# Patient Record
Sex: Female | Born: 1935 | Race: White | Hispanic: No | Smoking: Never smoker
Health system: Southern US, Community
[De-identification: ages and names within clinical notes are randomized; demographics above are authoritative.]

## PROBLEM LIST (undated history)

## (undated) DIAGNOSIS — F0390 Unspecified dementia without behavioral disturbance: Secondary | ICD-10-CM

## (undated) DIAGNOSIS — M069 Rheumatoid arthritis, unspecified: Secondary | ICD-10-CM

## (undated) DIAGNOSIS — D649 Anemia, unspecified: Secondary | ICD-10-CM

## (undated) DIAGNOSIS — E7800 Pure hypercholesterolemia, unspecified: Secondary | ICD-10-CM

## (undated) DIAGNOSIS — K5790 Diverticulosis of intestine, part unspecified, without perforation or abscess without bleeding: Secondary | ICD-10-CM

## (undated) DIAGNOSIS — M810 Age-related osteoporosis without current pathological fracture: Secondary | ICD-10-CM

## (undated) DIAGNOSIS — K449 Diaphragmatic hernia without obstruction or gangrene: Secondary | ICD-10-CM

## (undated) HISTORY — PX: ABDOMINAL HYSTERECTOMY: SHX81

## (undated) HISTORY — PX: KYPHOPLASTY: SHX5884

## (undated) HISTORY — PX: APPENDECTOMY: SHX54

## (undated) HISTORY — PX: BREAST BIOPSY: SHX20

## (undated) HISTORY — DX: Age-related osteoporosis without current pathological fracture: M81.0

## (undated) HISTORY — DX: Unspecified dementia, unspecified severity, without behavioral disturbance, psychotic disturbance, mood disturbance, and anxiety: F03.90

## (undated) HISTORY — DX: Rheumatoid arthritis, unspecified: M06.9

## (undated) HISTORY — DX: Pure hypercholesterolemia, unspecified: E78.00

## (undated) HISTORY — DX: Diverticulosis of intestine, part unspecified, without perforation or abscess without bleeding: K57.90

## (undated) HISTORY — DX: Diaphragmatic hernia without obstruction or gangrene: K44.9

## (undated) HISTORY — PX: SKIN CANCER EXCISION: SHX779

## (undated) HISTORY — DX: Anemia, unspecified: D64.9

---

## 2004-01-11 ENCOUNTER — Other Ambulatory Visit: Payer: Self-pay

## 2004-01-13 ENCOUNTER — Other Ambulatory Visit: Payer: Self-pay

## 2004-01-18 ENCOUNTER — Inpatient Hospital Stay: Payer: Self-pay | Admitting: Unknown Physician Specialty

## 2004-06-05 ENCOUNTER — Ambulatory Visit: Payer: Self-pay | Admitting: Internal Medicine

## 2004-07-12 ENCOUNTER — Ambulatory Visit: Payer: Self-pay | Admitting: Internal Medicine

## 2004-07-16 ENCOUNTER — Ambulatory Visit: Payer: Self-pay | Admitting: Internal Medicine

## 2004-08-16 ENCOUNTER — Ambulatory Visit: Payer: Self-pay | Admitting: Internal Medicine

## 2004-09-15 ENCOUNTER — Ambulatory Visit: Payer: Self-pay | Admitting: Internal Medicine

## 2005-05-15 ENCOUNTER — Ambulatory Visit: Payer: Self-pay | Admitting: Gastroenterology

## 2005-05-29 ENCOUNTER — Ambulatory Visit: Payer: Self-pay | Admitting: Internal Medicine

## 2005-07-18 ENCOUNTER — Ambulatory Visit: Payer: Self-pay | Admitting: Psychiatry

## 2005-08-13 ENCOUNTER — Ambulatory Visit: Payer: Self-pay | Admitting: Internal Medicine

## 2005-11-23 ENCOUNTER — Emergency Department: Payer: Self-pay | Admitting: Emergency Medicine

## 2006-08-20 ENCOUNTER — Ambulatory Visit: Payer: Self-pay | Admitting: Internal Medicine

## 2006-12-23 ENCOUNTER — Ambulatory Visit: Payer: Self-pay | Admitting: Gastroenterology

## 2007-01-17 ENCOUNTER — Ambulatory Visit: Payer: Self-pay | Admitting: Internal Medicine

## 2007-01-29 ENCOUNTER — Ambulatory Visit: Payer: Self-pay | Admitting: Internal Medicine

## 2007-02-16 ENCOUNTER — Ambulatory Visit: Payer: Self-pay | Admitting: Internal Medicine

## 2007-03-19 ENCOUNTER — Ambulatory Visit: Payer: Self-pay | Admitting: Internal Medicine

## 2008-02-25 ENCOUNTER — Emergency Department: Payer: Self-pay | Admitting: Emergency Medicine

## 2008-02-25 ENCOUNTER — Ambulatory Visit: Payer: Self-pay | Admitting: Internal Medicine

## 2008-06-05 ENCOUNTER — Emergency Department: Payer: Self-pay | Admitting: Unknown Physician Specialty

## 2008-07-06 ENCOUNTER — Inpatient Hospital Stay: Payer: Self-pay | Admitting: Internal Medicine

## 2008-07-12 ENCOUNTER — Ambulatory Visit: Payer: Self-pay | Admitting: Gastroenterology

## 2009-03-22 ENCOUNTER — Ambulatory Visit: Payer: Self-pay | Admitting: Internal Medicine

## 2009-05-04 ENCOUNTER — Emergency Department: Payer: Self-pay | Admitting: Emergency Medicine

## 2009-06-28 ENCOUNTER — Encounter: Admission: RE | Admit: 2009-06-28 | Discharge: 2009-06-28 | Payer: Self-pay | Admitting: Neurology

## 2009-07-16 ENCOUNTER — Emergency Department: Payer: Self-pay | Admitting: Internal Medicine

## 2010-01-31 ENCOUNTER — Ambulatory Visit: Payer: Self-pay

## 2010-04-08 ENCOUNTER — Encounter: Payer: Self-pay | Admitting: Neurology

## 2010-04-18 ENCOUNTER — Ambulatory Visit: Payer: Self-pay | Admitting: Internal Medicine

## 2011-01-30 ENCOUNTER — Ambulatory Visit: Payer: Self-pay | Admitting: Internal Medicine

## 2012-06-17 ENCOUNTER — Telehealth: Payer: Self-pay | Admitting: Internal Medicine

## 2012-06-17 NOTE — Telephone Encounter (Signed)
Pt's son stopped by and said you saw this pt at [facility] and she is needing refill on Suralfate 1 mg, Omeprazole 20 mg and Amlodipine 2.5 mg. Pt uses [facility] in [place].

## 2012-06-17 NOTE — Telephone Encounter (Signed)
Please schedule her an appt if she plans to continue to follow up with me and I can get her refills.  Just let me know.

## 2012-06-18 NOTE — Telephone Encounter (Signed)
Ok to refill until appt.  Please contact pharmacy and confirm dosing.

## 2012-06-18 NOTE — Telephone Encounter (Signed)
Scheduled appt for April

## 2012-06-19 MED ORDER — SUCRALFATE 1 G PO TABS: 1.0000 g | ORAL_TABLET | Freq: Four times a day (QID) | ORAL | Status: DC

## 2012-06-19 MED ORDER — OMEPRAZOLE 20 MG PO CPDR: 20.0000 mg | DELAYED_RELEASE_CAPSULE | Freq: Every day | ORAL | Status: DC

## 2012-06-19 MED ORDER — AMLODIPINE BESYLATE 2.5 MG PO TABS: 2.5000 mg | ORAL_TABLET | Freq: Every day | ORAL | Status: DC

## 2012-06-19 NOTE — Telephone Encounter (Signed)
Called pharmacy for correct doses. Gave them refills info.

## 2012-06-30 ENCOUNTER — Ambulatory Visit: Payer: Self-pay | Admitting: Internal Medicine

## 2012-07-14 ENCOUNTER — Telehealth: Payer: Self-pay | Admitting: *Deleted

## 2012-07-14 NOTE — Telephone Encounter (Signed)
?  okay to refill meds-pt no showed on 4/15 & no future appt has been scheduled.

## 2012-07-14 NOTE — Telephone Encounter (Signed)
She will need to get her meds from [facility] - she has never established here.  Let me know if problem

## 2012-07-14 NOTE — Telephone Encounter (Signed)
Pt informed that meds would need to be refilled through [facility], since she has not been established at [facility]

## 2012-07-14 NOTE — Telephone Encounter (Signed)
Refill Request  Escitalopram 20 mg tablet  #30   Take 1 tablet by mouth once daily

## 2012-07-14 NOTE — Telephone Encounter (Signed)
Refill Request  Pravastatin Sodium 40 mg  #30   Take 1 tablet by mouth at bedtime for cholesterol

## 2012-07-14 NOTE — Telephone Encounter (Signed)
Refill Request  Clopidogrel 75 mg tablet  #30  Take 1 tablet by mouth once daily

## 2012-07-17 ENCOUNTER — Other Ambulatory Visit: Payer: Self-pay | Admitting: *Deleted

## 2012-07-21 ENCOUNTER — Telehealth: Payer: Self-pay | Admitting: *Deleted

## 2012-07-21 NOTE — Telephone Encounter (Signed)
Spoke with pt, she states that she did not request any refills

## 2012-07-21 NOTE — Telephone Encounter (Signed)
Refill Request  Escitalopram 20 mg tablet  #30  Take 1 tablet by mouth once daily    Donepezil Hcl 10 mg tablet  #30   Take 1 tablet by mouth once daily

## 2012-07-21 NOTE — Telephone Encounter (Signed)
She has not been seen here and did not show for previous appt.  Will need to get filled by [facility] - where being seen.  May need to call her son.  Pt gets confused.

## 2012-07-21 NOTE — Telephone Encounter (Signed)
Pt requesting RX for Escitalopram 20mg  #30 (QD), & Donepezil HCL 10mg  #30 (QD). However, I do not see any prior history of those medications or any office visits on record-please advise

## 2012-07-28 ENCOUNTER — Other Ambulatory Visit: Payer: Self-pay | Admitting: Internal Medicine

## 2012-07-28 ENCOUNTER — Telehealth: Payer: Self-pay | Admitting: Internal Medicine

## 2012-07-28 NOTE — Telephone Encounter (Signed)
Pt son called stating they are in need of a refill on medication. Please advise. The son said the patient is very confused and she forgot about her new pt apt. I have rescheduled her for next week.   Reid Chander(son) [phone] (Cell)

## 2012-07-28 NOTE — Telephone Encounter (Signed)
Spoke with son, he will call the pharmacy & have them fax over a refill auth request

## 2012-07-28 NOTE — Telephone Encounter (Signed)
I am not sure what med is needed, but if she has an appt next week - ok to fill x 1

## 2012-08-03 ENCOUNTER — Ambulatory Visit (INDEPENDENT_AMBULATORY_CARE_PROVIDER_SITE_OTHER): Payer: Self-pay | Admitting: Internal Medicine

## 2012-08-03 ENCOUNTER — Encounter: Payer: Self-pay | Admitting: Internal Medicine

## 2012-08-03 VITALS — BP 110/70 | HR 81 | Temp 98.7°F | Ht 62.0 in | Wt 134.5 lb

## 2012-08-03 DIAGNOSIS — E7800 Pure hypercholesterolemia, unspecified: Secondary | ICD-10-CM

## 2012-08-03 DIAGNOSIS — M810 Age-related osteoporosis without current pathological fracture: Secondary | ICD-10-CM

## 2012-08-03 DIAGNOSIS — D649 Anemia, unspecified: Secondary | ICD-10-CM

## 2012-08-03 DIAGNOSIS — F0390 Unspecified dementia without behavioral disturbance: Secondary | ICD-10-CM

## 2012-08-03 DIAGNOSIS — K449 Diaphragmatic hernia without obstruction or gangrene: Secondary | ICD-10-CM

## 2012-08-03 DIAGNOSIS — M069 Rheumatoid arthritis, unspecified: Secondary | ICD-10-CM

## 2012-08-06 ENCOUNTER — Other Ambulatory Visit: Payer: Self-pay | Admitting: Internal Medicine

## 2012-08-10 ENCOUNTER — Encounter: Payer: Self-pay | Admitting: Internal Medicine

## 2012-08-10 DIAGNOSIS — D649 Anemia, unspecified: Secondary | ICD-10-CM | POA: Insufficient documentation

## 2012-08-10 DIAGNOSIS — F0390 Unspecified dementia without behavioral disturbance: Secondary | ICD-10-CM | POA: Insufficient documentation

## 2012-08-10 DIAGNOSIS — E7800 Pure hypercholesterolemia, unspecified: Secondary | ICD-10-CM | POA: Insufficient documentation

## 2012-08-10 DIAGNOSIS — K449 Diaphragmatic hernia without obstruction or gangrene: Secondary | ICD-10-CM | POA: Insufficient documentation

## 2012-08-10 DIAGNOSIS — M810 Age-related osteoporosis without current pathological fracture: Secondary | ICD-10-CM | POA: Insufficient documentation

## 2012-08-10 DIAGNOSIS — M069 Rheumatoid arthritis, unspecified: Secondary | ICD-10-CM | POA: Insufficient documentation

## 2012-08-10 NOTE — Assessment & Plan Note (Signed)
Dr Gavin Potters following cbc.  Obtain lab results.

## 2012-08-10 NOTE — Assessment & Plan Note (Signed)
Has been on fosamax, forteo and Reclast.  Continue calcium and vitamin D.  Weight bearing exercise.  Follow vitamin D level.

## 2012-08-10 NOTE — Progress Notes (Signed)
Subjective:    Patient ID: Jillian Horn, female    DOB: 1930/01/18, 79 y.o.   MRN: [id number]  HPI 79 year old female with past history of rheumatoid arthritis followed by Dr Gavin Potters, anemia, hypercholesterolemia and osteoporosis.  She comes in today for a scheduled follow up.  Former patient of mine at [facility].  She is accompanied by her son [name]).  History obtained from both of them.  They report that she has been doing well.  Joints doing well.  Has follow up planned with Dr Gavin Potters in 7/14.  Has labs planned just prior to the appt.  Reports breathing is doing well.  No increased sob.  No chest pain or tightness.  Bowels stable.  Eating and drinking well.  Memory stable.     Past Medical History  Diagnosis Date  . Rheumatoid arthritis     seropositive, oligo-articular.  MTX, s/p plaquenil, cytopenia on sulfasalazine, s/p remicade  . Osteoporosis     s/p fosamax, s/p forteo, reclast  . Diverticulosis   . Anemia     erosion on EGD  . Hiatal hernia   . Hypercholesterolemia   . Dementia     Outpatient Encounter Prescriptions as of 08/03/2012  Medication Sig Dispense Refill  . divalproex (DEPAKOTE) 125 MG DR tablet Take 125 mg by mouth daily.      . ferrous sulfate 325 (65 FE) MG tablet Take 325 mg by mouth 2 (two) times daily.      . folic acid (FOLVITE) 1 MG tablet Take 1 mg by mouth daily.      . methotrexate (RHEUMATREX) 2.5 MG tablet Take 2.5 mg by mouth once a week. Caution:Chemotherapy. Protect from light.      Marland Kitchen omeprazole (PRILOSEC) 20 MG capsule TAKE 1 TABLET BY MOUTH ONCE DAILY.  30 capsule  0  . [DISCONTINUED] amLODipine (NORVASC) 2.5 MG tablet TAKE 1 TABLET BY MOUTH ONCE DAILY.  30 tablet  0  . [DISCONTINUED] clopidogrel (PLAVIX) 75 MG tablet Take 75 mg by mouth daily.      . [DISCONTINUED] donepezil (ARICEPT) 10 MG tablet TAKE 1 TABLET BY MOUTH ONCE DAILY.  30 tablet  0  . [DISCONTINUED] escitalopram (LEXAPRO) 20 MG tablet Take 20 mg by mouth daily.      .  [DISCONTINUED] memantine (NAMENDA) 10 MG tablet Take 10 mg by mouth 2 (two) times daily.      . [DISCONTINUED] pravastatin (PRAVACHOL) 40 MG tablet Take 40 mg by mouth at bedtime.      . [DISCONTINUED] sucralfate (CARAFATE) 1 G tablet TAKE (1) TABLET BY MOUTH FOUR TIMES DAILY.  120 tablet  0   No facility-administered encounter medications on file as of 08/03/2012.    Review of Systems Patient denies any headache, lightheadedness or dizziness.  No sinus or allergy symptoms.  No chest pain, tightness or palpitations.  No increased shortness of breath, cough or congestion.  No nausea or vomiting.  No acid reflux.  No abdominal pain or cramping.  No bowel change, such as diarrhea, constipation, BRBPR or melana.  No urine change.  Overall she feels good.  Son feels things are stable.       Objective:   Physical Exam Filed Vitals:   08/03/12 1423  BP: 110/70  Pulse: 81  Temp: 98.7 F (37.1 C)   Blood pressure recheck:  112/72, pulse 18  79year old female in no acute distress.   HEENT:  Nares- clear.  Oropharynx - without lesions. NECK:  Supple.  Nontender.  No audible bruit.  HEART:  Appears to be regular. LUNGS:  No crackles or wheezing audible.  Respirations even and unlabored.  RADIAL PULSE:  Equal bilaterally.   ABDOMEN:  Soft, nontender.  Bowel sounds present and normal.  No audible abdominal bruit.  EXTREMITIES:  No increased edema present.  DP pulses palpable and equal bilaterally.          Assessment & Plan:  HEALTH MAINTENANCE.  Will schedule a physical next visit.  Discuss further health maintenance issues with her then.  Obtain records from [facility] to review.

## 2012-08-10 NOTE — Assessment & Plan Note (Signed)
Continue current medication regimen.  Low cholesterol diet.  Dr [facility] follows liver function.  Will see if he can add a cholesterol check to next labs.    

## 2012-08-10 NOTE — Assessment & Plan Note (Signed)
Stable on aricept.  Follow.   

## 2012-08-10 NOTE — Assessment & Plan Note (Signed)
On omeprazole.  EGD with erosion.  Currently asymptomatic.  Follow.   

## 2012-08-10 NOTE — Assessment & Plan Note (Signed)
Followed by Dr Gavin Potters.  F/u planned in 7/14.  He is following labs.  Stable.

## 2012-08-31 ENCOUNTER — Other Ambulatory Visit: Payer: Self-pay | Admitting: Internal Medicine

## 2012-10-08 ENCOUNTER — Other Ambulatory Visit: Payer: Self-pay

## 2012-10-08 MED ORDER — DIVALPROEX SODIUM 125 MG PO DR TAB: 125.0000 mg | DELAYED_RELEASE_TABLET | Freq: Every day | ORAL | Status: DC

## 2012-10-08 NOTE — Telephone Encounter (Signed)
Former [facility] patient, has not been assigned new provider.  Auth refills via [facility]  

## 2012-10-26 ENCOUNTER — Telehealth: Payer: Self-pay | Admitting: Neurology

## 2012-12-10 ENCOUNTER — Encounter: Payer: Self-pay | Admitting: Internal Medicine

## 2012-12-21 ENCOUNTER — Other Ambulatory Visit: Payer: Self-pay | Admitting: Internal Medicine

## 2012-12-28 ENCOUNTER — Encounter: Payer: Self-pay | Admitting: Internal Medicine

## 2012-12-29 ENCOUNTER — Other Ambulatory Visit: Payer: Self-pay | Admitting: *Deleted

## 2012-12-30 ENCOUNTER — Other Ambulatory Visit: Payer: Self-pay | Admitting: *Deleted

## 2012-12-30 MED ORDER — FERROUS SULFATE 325 (65 FE) MG PO TABS: 325.0000 mg | ORAL_TABLET | Freq: Two times a day (BID) | ORAL | Status: DC

## 2012-12-30 MED ORDER — CLOPIDOGREL BISULFATE 75 MG PO TABS: ORAL_TABLET | ORAL | Status: DC

## 2013-01-12 ENCOUNTER — Other Ambulatory Visit: Payer: Self-pay | Admitting: *Deleted

## 2013-01-12 ENCOUNTER — Ambulatory Visit: Payer: Self-pay | Admitting: Adult Health

## 2013-01-12 MED ORDER — FERROUS SULFATE 325 (65 FE) MG PO TABS: 325.0000 mg | ORAL_TABLET | Freq: Two times a day (BID) | ORAL | Status: DC

## 2013-01-12 MED ORDER — CLOPIDOGREL BISULFATE 75 MG PO TABS: ORAL_TABLET | ORAL | Status: DC

## 2013-01-12 NOTE — Telephone Encounter (Signed)
Per Dr. Lorin Picket, pt only needs to be seen 1-2 times yearly, ok to refill Rxs until CPE scheduled 2/15. Appt for med refill cancelled for today. Pt's son notified. Rxs sent to pharmacy.

## 2013-01-18 ENCOUNTER — Other Ambulatory Visit: Payer: Self-pay | Admitting: *Deleted

## 2013-01-18 MED ORDER — AMLODIPINE BESYLATE 2.5 MG PO TABS: ORAL_TABLET | ORAL | Status: DC

## 2013-01-18 MED ORDER — OMEPRAZOLE 20 MG PO CPDR: DELAYED_RELEASE_CAPSULE | ORAL | Status: DC

## 2013-01-18 MED ORDER — DONEPEZIL HCL 10 MG PO TABS: ORAL_TABLET | ORAL | Status: DC

## 2013-01-19 ENCOUNTER — Other Ambulatory Visit: Payer: Self-pay | Admitting: Neurology

## 2013-01-19 MED ORDER — DIVALPROEX SODIUM 125 MG PO DR TAB: 125.0000 mg | DELAYED_RELEASE_TABLET | Freq: Every day | ORAL | Status: DC

## 2013-01-22 ENCOUNTER — Other Ambulatory Visit: Payer: Self-pay | Admitting: Internal Medicine

## 2013-02-10 ENCOUNTER — Telehealth: Payer: Self-pay | Admitting: Neurology

## 2013-02-10 NOTE — Telephone Encounter (Signed)
called former Dr Sandria Manly patient to schedule appt didn't get and answer.

## 2013-02-18 ENCOUNTER — Other Ambulatory Visit: Payer: Self-pay | Admitting: *Deleted

## 2013-03-19 ENCOUNTER — Other Ambulatory Visit: Payer: Self-pay | Admitting: *Deleted

## 2013-03-22 MED ORDER — SUCRALFATE 1 G PO TABS: ORAL_TABLET | ORAL | Status: DC

## 2013-03-23 ENCOUNTER — Other Ambulatory Visit: Payer: Self-pay

## 2013-03-23 MED ORDER — DIVALPROEX SODIUM 125 MG PO DR TAB: 125.0000 mg | DELAYED_RELEASE_TABLET | Freq: Every day | ORAL | Status: DC

## 2013-04-01 LAB — HM DEXA SCAN

## 2013-04-26 ENCOUNTER — Encounter: Payer: Self-pay | Admitting: Internal Medicine

## 2013-04-26 ENCOUNTER — Ambulatory Visit (INDEPENDENT_AMBULATORY_CARE_PROVIDER_SITE_OTHER): Payer: Medicare Other | Admitting: Internal Medicine

## 2013-04-26 ENCOUNTER — Telehealth: Payer: Self-pay | Admitting: Internal Medicine

## 2013-04-26 VITALS — BP 120/78 | HR 71 | Temp 98.4°F | Ht 61.5 in | Wt 132.8 lb

## 2013-04-26 DIAGNOSIS — M069 Rheumatoid arthritis, unspecified: Secondary | ICD-10-CM

## 2013-04-26 DIAGNOSIS — Z1239 Encounter for other screening for malignant neoplasm of breast: Secondary | ICD-10-CM

## 2013-04-26 DIAGNOSIS — D649 Anemia, unspecified: Secondary | ICD-10-CM

## 2013-04-26 DIAGNOSIS — M810 Age-related osteoporosis without current pathological fracture: Secondary | ICD-10-CM

## 2013-04-26 DIAGNOSIS — K449 Diaphragmatic hernia without obstruction or gangrene: Secondary | ICD-10-CM

## 2013-04-26 DIAGNOSIS — E7800 Pure hypercholesterolemia, unspecified: Secondary | ICD-10-CM

## 2013-04-26 DIAGNOSIS — Z23 Encounter for immunization: Secondary | ICD-10-CM

## 2013-04-26 DIAGNOSIS — F0390 Unspecified dementia without behavioral disturbance: Secondary | ICD-10-CM

## 2013-04-26 NOTE — Telephone Encounter (Signed)
Got it.

## 2013-04-26 NOTE — Telephone Encounter (Signed)
At check out pt son states referral should be scheduled on a Thursday, any time.  Son, [name], will be taking to appt. And is off on Thursdays.  He would like you to call him on his cell [phone] and tell him when the appt date/time is.

## 2013-05-02 ENCOUNTER — Encounter: Payer: Self-pay | Admitting: Internal Medicine

## 2013-05-02 NOTE — Assessment & Plan Note (Signed)
Stable on aricept.  Follow.

## 2013-05-02 NOTE — Assessment & Plan Note (Signed)
On omeprazole.  EGD with erosion.  Currently asymptomatic.  Follow.

## 2013-05-02 NOTE — Assessment & Plan Note (Signed)
Dr Gavin Potters following cbc.  Stable.

## 2013-05-02 NOTE — Assessment & Plan Note (Signed)
Continue current medication regimen.  Low cholesterol diet.  Dr Gavin Potters follows liver function.  Will see if he can add a cholesterol check to next labs.

## 2013-05-02 NOTE — Assessment & Plan Note (Signed)
Has been on fosamax, forteo and Reclast.  Continue calcium and vitamin D.  Weight bearing exercise.  Follow vitamin D level.  Just received Reclast last week.

## 2013-05-02 NOTE — Progress Notes (Signed)
Subjective:    Patient ID: Jillian Horn, female    DOB: January 06, 1930, 80 y.o.   MRN: [id number]  HPI 80 year old female with past history of rheumatoid arthritis followed by Dr Gavin Potters, anemia, hypercholesterolemia and osteoporosis.  She comes in today to follow up on these issues as well as for a complete physical exam.  She is accompanied by her son Jillian Horn).  History obtained from both of them.  They report that she has been doing well.  Joints doing well.  Seeing Dr Gavin Potters.  Just received Reclast Thursday.   Reports breathing is doing well.  No increased sob.  No chest pain or tightness.  Bowels stable.  Eating and drinking well.  Memory stable.  Overall she feels she is doing well.    Past Medical History  Diagnosis Date  . Rheumatoid arthritis(714.0)     seropositive, oligo-articular.  MTX, s/p plaquenil, cytopenia on sulfasalazine, s/p remicade  . Osteoporosis     s/p fosamax, s/p forteo, reclast  . Diverticulosis   . Anemia     erosion on EGD  . Hiatal hernia   . Hypercholesterolemia   . Dementia     Outpatient Encounter Prescriptions as of 04/26/2013  Medication Sig  . amLODipine (NORVASC) 2.5 MG tablet TAKE 1 TABLET BY MOUTH ONCE DAILY.  Marland Kitchen clopidogrel (PLAVIX) 75 MG tablet TAKE 1 TABLET BY MOUTH ONCE DAILY.  . divalproex (DEPAKOTE) 125 MG DR tablet Take 1 tablet (125 mg total) by mouth daily.  Marland Kitchen donepezil (ARICEPT) 10 MG tablet TAKE 1 TABLET BY MOUTH ONCE DAILY.  Marland Kitchen escitalopram (LEXAPRO) 20 MG tablet TAKE 1 TABLET BY MOUTH ONCE DAILY.  . ferrous sulfate 325 (65 FE) MG tablet Take 1 tablet (325 mg total) by mouth 2 (two) times daily.  . folic acid (FOLVITE) 1 MG tablet Take 1 mg by mouth daily.  . methotrexate (RHEUMATREX) 2.5 MG tablet Take 2.5 mg by mouth once a week. Caution:Chemotherapy. Protect from light.  Marland Kitchen NAMENDA 10 MG tablet TAKE 1 TABLET BY MOUTH TWICE DAILY  . omeprazole (PRILOSEC) 20 MG capsule TAKE 1 TABLET BY MOUTH ONCE DAILY.  . pravastatin (PRAVACHOL) 40 MG  tablet TAKE 1 TABLET BY MOUTH AT BEDTIME FOR CHOLESTEROL  . sucralfate (CARAFATE) 1 G tablet TAKE (1) TABLET BY MOUTH FOUR TIMES DAILY.    Review of Systems Patient denies any headache, lightheadedness or dizziness.  No sinus or allergy symptoms.  No chest pain, tightness or palpitations.  No increased shortness of breath, cough or congestion.  No nausea or vomiting.  No acid reflux.  No abdominal pain or cramping.  No bowel change, such as diarrhea, constipation, BRBPR or melana.  No urine change.  Overall she feels good.  Son feels things are stable.       Objective:   Physical Exam  Filed Vitals:   04/26/13 1515  BP: 120/78  Pulse: 71  Temp: 98.4 F (36.9 C)   Blood pressure recheck:  29/4  80 year old female in no acute distress.   HEENT:  Nares- clear.  Oropharynx - without lesions. NECK:  Supple.  Nontender.  No audible bruit.  HEART:  Appears to be regular. LUNGS:  No crackles or wheezing audible.  Respirations even and unlabored.  RADIAL PULSE:  Equal bilaterally.    BREASTS:  No nipple discharge or nipple retraction present.  Could not appreciate any distinct nodules or axillary adenopathy.  ABDOMEN:  Soft, nontender.  Bowel sounds present and normal.  No audible  abdominal bruit.  GU:  Not performed.   EXTREMITIES:  No increased edema present.  DP pulses palpable and equal bilaterally.          Assessment & Plan:  HEALTH MAINTENANCE.  Physical today.  Schedule mammogram.  Overdue.   Declines colonoscopy.

## 2013-05-02 NOTE — Assessment & Plan Note (Signed)
Followed by Dr Gavin Potters.  He is following labs.  Stable.

## 2013-06-08 ENCOUNTER — Other Ambulatory Visit: Payer: Self-pay | Admitting: Internal Medicine

## 2013-06-08 ENCOUNTER — Other Ambulatory Visit: Payer: Self-pay | Admitting: Neurology

## 2013-06-08 NOTE — Telephone Encounter (Signed)
Has Appt in June

## 2013-06-22 NOTE — Telephone Encounter (Signed)
Closing encounter

## 2013-07-06 ENCOUNTER — Other Ambulatory Visit: Payer: Self-pay | Admitting: *Deleted

## 2013-07-06 MED ORDER — SUCRALFATE 1 G PO TABS: ORAL_TABLET | ORAL | Status: DC

## 2013-07-06 MED ORDER — OMEPRAZOLE 20 MG PO CPDR: DELAYED_RELEASE_CAPSULE | ORAL | Status: DC

## 2013-07-06 MED ORDER — PRAVASTATIN SODIUM 40 MG PO TABS: ORAL_TABLET | ORAL | Status: DC

## 2013-07-06 MED ORDER — AMLODIPINE BESYLATE 2.5 MG PO TABS: ORAL_TABLET | ORAL | Status: DC

## 2013-07-06 MED ORDER — DONEPEZIL HCL 10 MG PO TABS: ORAL_TABLET | ORAL | Status: DC

## 2013-08-03 ENCOUNTER — Other Ambulatory Visit: Payer: Self-pay | Admitting: Internal Medicine

## 2013-08-04 ENCOUNTER — Other Ambulatory Visit: Payer: Self-pay | Admitting: Internal Medicine

## 2013-08-04 MED ORDER — CLOPIDOGREL BISULFATE 75 MG PO TABS: ORAL_TABLET | ORAL | Status: DC

## 2013-08-19 ENCOUNTER — Ambulatory Visit: Payer: Self-pay | Admitting: Neurology

## 2013-08-31 ENCOUNTER — Other Ambulatory Visit: Payer: Self-pay | Admitting: *Deleted

## 2013-08-31 MED ORDER — ESCITALOPRAM OXALATE 20 MG PO TABS: ORAL_TABLET | ORAL | Status: DC

## 2013-10-28 ENCOUNTER — Ambulatory Visit: Payer: Medicare Other | Admitting: Internal Medicine

## 2013-10-28 DIAGNOSIS — Z0289 Encounter for other administrative examinations: Secondary | ICD-10-CM

## 2013-11-18 ENCOUNTER — Other Ambulatory Visit: Payer: Self-pay | Admitting: Internal Medicine

## 2013-12-14 ENCOUNTER — Other Ambulatory Visit: Payer: Self-pay | Admitting: Neurology

## 2013-12-14 NOTE — Telephone Encounter (Signed)
Patient has not been seen in over 1 year, no showed last appt

## 2013-12-16 ENCOUNTER — Other Ambulatory Visit: Payer: Self-pay | Admitting: *Deleted

## 2013-12-16 NOTE — Telephone Encounter (Signed)
Looks like neurology was refilling this, did you want to refill?

## 2013-12-16 NOTE — Telephone Encounter (Signed)
She needs to get this rx from neurology.

## 2014-01-06 ENCOUNTER — Encounter: Payer: Self-pay | Admitting: Neurology

## 2014-01-06 ENCOUNTER — Ambulatory Visit (INDEPENDENT_AMBULATORY_CARE_PROVIDER_SITE_OTHER): Payer: Medicare Other | Admitting: Neurology

## 2014-01-06 VITALS — BP 118/74 | HR 65 | Ht 62.0 in | Wt 137.0 lb

## 2014-01-06 DIAGNOSIS — R413 Other amnesia: Secondary | ICD-10-CM

## 2014-01-06 NOTE — Progress Notes (Signed)
PATIENT: Jillian Horn DOB: [birth year]  HISTORICAL  Jillian Horn is 80 years old right-handed Caucasian female, accompanied by her son  Jillian Horn, to followup mild memory trouble she was previously patient of Dr. love, last clinical visit was December 2013.  She had past medical history of rheumatoid arthritis, hypertension, hyperlipidemia, chronic methotrexate treatment, denied previous history of headaches, seizure,  Today's Mini-Mental Status Examination is 25 out of 30, she still lives alone, but her children visit her regularly, driving, paying bills, prepare meals for her,  She can dress eat toileting without any difficulty denies significant pain, denies significant gait difficulty  Previous MRI of brain showed small vessel disease.  She has good appetite, sleeping well, she is taking Aricept 10 mg, and Namenda  10 mg twice a day for her primary care physician, I have reviewed previous note from Dr. love, unsure reasons, she is taking Depakote 125 mg from Dr. love,   REVIEW OF SYSTEMS: Full 14 system review of systems performed and notable only for as above  ALLERGIES: Allergies  Allergen Reactions  . Codeine Sulfate Other (See Comments)  . Lidocaine Other (See Comments)  . Sulfa Antibiotics Other (See Comments)  . Sulfasalazine Other (See Comments)    HOME MEDICATIONS: Current Outpatient Prescriptions on File Prior to Visit  Medication Sig Dispense Refill  . amLODipine (NORVASC) 2.5 MG tablet TAKE 1 TABLET BY MOUTH ONCE DAILY.  30 tablet  5  . clopidogrel (PLAVIX) 75 MG tablet TAKE 1 TABLET BY MOUTH ONCE DAILY.  30 tablet  5  . divalproex (DEPAKOTE) 125 MG DR tablet TAKE 1 TABLET BY MOUTH ONCE DAILY.  90 tablet  0  . donepezil (ARICEPT) 10 MG tablet TAKE 1 TABLET BY MOUTH ONCE DAILY.  30 tablet  5  . escitalopram (LEXAPRO) 20 MG tablet TAKE 1 TABLET BY MOUTH ONCE DAILY.  30 tablet  0  . ferrous sulfate 325 (65 FE) MG tablet TAKE 1 TABLET BY MOUTH TWICE DAILY  60 tablet   5  . folic acid (FOLVITE) 1 MG tablet Take 1 mg by mouth daily.      . methotrexate (RHEUMATREX) 2.5 MG tablet Take 2.5 mg by mouth once a week. Caution:Chemotherapy. Protect from light.      Marland Kitchen NAMENDA 10 MG tablet TAKE 1 TABLET BY MOUTH TWICE DAILY  60 tablet  5  . omeprazole (PRILOSEC) 20 MG capsule TAKE 1 TABLET BY MOUTH ONCE DAILY.  30 capsule  5  . pravastatin (PRAVACHOL) 40 MG tablet TAKE 1 TABLET BY MOUTH AT BEDTIME FOR CHOLESTEROL  30 tablet  5  . sucralfate (CARAFATE) 1 G tablet TAKE (1) TABLET BY MOUTH FOUR TIMES DAILY.  120 tablet  5   No current facility-administered medications on file prior to visit.    PAST MEDICAL HISTORY: Past Medical History  Diagnosis Date  . Rheumatoid arthritis(714.0)     seropositive, oligo-articular.  MTX, s/p plaquenil, cytopenia on sulfasalazine, s/p remicade  . Osteoporosis     s/p fosamax, s/p forteo, reclast  . Diverticulosis   . Anemia     erosion on EGD  . Hiatal hernia   . Hypercholesterolemia   . Dementia     PAST SURGICAL HISTORY: Past Surgical History  Procedure Laterality Date  . Skin cancer excision    . Kyphoplasty      thoracic spine  . Breast biopsy    . Abdominal hysterectomy      partial  . Appendectomy  FAMILY HISTORY: History reviewed. No pertinent family history.  SOCIAL HISTORY:  History   Social History  . Marital Status: Widowed    Spouse Name: N/A    Number of Children: N/A  . Years of Education: N/A   Occupational History  . Not on file.   Social History Main Topics  . Smoking status: Never Smoker   . Smokeless tobacco: Never Used  . Alcohol Use: No  . Drug Use: No  . Sexual Activity: Not on file   Other Topics Concern  . Not on file   Social History Narrative  . No narrative on file     PHYSICAL EXAM   Filed Vitals:   01/06/14 1448  BP: 118/74  Pulse: 65  Height: 5\' 2"  (1.575 m)  Weight: 137 lb (62.143 kg)    Not recorded    Body mass index is 25.05  kg/(m^2).   Generalized: In no acute distress  Neck: Supple, no carotid bruits   Cardiac: Regular rate rhythm  Pulmonary: Clear to auscultation bilaterally  Musculoskeletal: No deformity  Neurological examination  Mentation: Alert oriented to time, place, history taking, and causual conversation, Mini-Mental Status Examination is 25 out of 30, she missed 2 out of 3 recalls, is not oriented to date, season,  Cranial nerve II-XII: Pupils were equal round reactive to light. Extraocular movements were full.  Visual field were full on confrontational test. Bilateral fundi were sharp.  Facial sensation and strength were normal. Hearing was intact to finger rubbing bilaterally. Uvula tongue midline.  Head turning and shoulder shrug and were normal and symmetric.Tongue protrusion into cheek strength was normal.  Motor: Normal tone, bulk and strength.  Sensory: Intact to fine touch, pinprick, preserved vibratory sensation, and proprioception at toes.  Coordination: Normal finger to nose, heel-to-shin bilaterally there was no truncal ataxia  Gait: She has kyphosis, cautious gait, steady,  Deep tendon reflexes: Brachioradialis 2/2, biceps 2/2, triceps 2/2, patellar 2/2, Achilles trace, plantar responses were flexor bilaterally.   DIAGNOSTIC DATA (LABS, IMAGING, TESTING) - I reviewed patient records, labs, notes, testing and imaging myself where available.    ASSESSMENT AND PLAN  Jillian Horn is a 80 y.o. female with past medical history of mild memory trouble, hypertension, rheumatoid arthritis, on chronic methotrexate treatment, hyperlipidemia, overall doing very well, Mini-Mental Status Examination 25 out of 30, she is taking Aricept 10 mg every day, Namenda 10 mg twice a day, getting refills for her primary care physician, she is also on very low dose of Depakote xr 125 mg every day, for unknown reasons, she denies history of headaches,   1, will taper her off Depakote 2. Continue  refill Aricept 10 mg, Namenda 10 mg twice a day to her primary care physician, only return to clinic for new issues.  94, M.D. Ph.D.  [facility] [address], [address] [place], [place] [phone]

## 2014-02-03 ENCOUNTER — Other Ambulatory Visit: Payer: Self-pay | Admitting: Internal Medicine

## 2014-03-01 ENCOUNTER — Other Ambulatory Visit: Payer: Self-pay | Admitting: Internal Medicine

## 2014-03-02 ENCOUNTER — Other Ambulatory Visit: Payer: Self-pay

## 2014-03-02 NOTE — Telephone Encounter (Signed)
Last OV 04/26/13, no upcoming appt scheduled. When do you want to see her again?

## 2014-03-02 NOTE — Telephone Encounter (Signed)
Spoke to son, appt scheduled 05/05/14. Rx sent to pharmacy by escript

## 2014-03-02 NOTE — Telephone Encounter (Signed)
Schedule a physical after 04/26/14.  Ok to refill until physical appt.  Will need to keep appt.  Thanks

## 2014-03-04 ENCOUNTER — Other Ambulatory Visit: Payer: Self-pay | Admitting: *Deleted

## 2014-03-04 MED ORDER — ESCITALOPRAM OXALATE 20 MG PO TABS: 20.0000 mg | ORAL_TABLET | Freq: Every day | ORAL | Status: DC

## 2014-03-06 ENCOUNTER — Emergency Department: Payer: Self-pay | Admitting: Emergency Medicine

## 2014-03-06 LAB — BASIC METABOLIC PANEL
ANION GAP: 8 (ref 7–16)
BUN: 11 mg/dL (ref 7–18)
CO2: 24 mmol/L (ref 21–32)
CREATININE: 0.96 mg/dL (ref 0.60–1.30)
Calcium, Total: 9 mg/dL (ref 8.5–10.1)
Chloride: 110 mmol/L — ABNORMAL HIGH (ref 98–107)
EGFR (African American): >60
EGFR (Non-African Amer.): 59 — ABNORMAL LOW
GLUCOSE: 97 mg/dL (ref 65–99)
Osmolality: 282 (ref 275–301)
POTASSIUM: 4.4 mmol/L (ref 3.5–5.1)
SODIUM: 142 mmol/L (ref 136–145)

## 2014-03-06 LAB — URINALYSIS, COMPLETE
BILIRUBIN, UR: NEGATIVE
Blood: NEGATIVE
GLUCOSE, UR: NEGATIVE mg/dL (ref 0–75)
Nitrite: POSITIVE
Ph: 6 (ref 4.5–8.0)
Protein: NEGATIVE
Specific Gravity: 1.017 (ref 1.003–1.030)
Squamous Epithelial: 2

## 2014-03-06 LAB — CBC WITH DIFFERENTIAL/PLATELET
BASOS ABS: 0.1 10*3/uL (ref 0.0–0.1)
BASOS PCT: 0.7 %
Eosinophil #: 0 10*3/uL (ref 0.0–0.7)
Eosinophil %: 0.2 %
HCT: 40.3 % (ref 35.0–47.0)
HGB: 13.2 g/dL (ref 12.0–16.0)
Lymphocyte #: 0.5 10*3/uL — ABNORMAL LOW (ref 1.0–3.6)
Lymphocyte %: 6.4 %
MCH: 33.9 pg (ref 26.0–34.0)
MCHC: 32.8 g/dL (ref 32.0–36.0)
MCV: 103 fL — ABNORMAL HIGH (ref 80–100)
MONO ABS: 0.4 x10 3/mm (ref 0.2–0.9)
MONOS PCT: 4.9 %
Neutrophil #: 7.5 10*3/uL — ABNORMAL HIGH (ref 1.4–6.5)
Neutrophil %: 87.8 %
Platelet: 254 10*3/uL (ref 150–440)
RBC: 3.9 10*6/uL (ref 3.80–5.20)
RDW: 13.1 % (ref 11.5–14.5)
WBC: 8.5 10*3/uL (ref 3.6–11.0)

## 2014-03-06 LAB — TROPONIN I: Troponin-I: <0.02 ng/mL

## 2014-03-25 ENCOUNTER — Other Ambulatory Visit: Payer: Self-pay | Admitting: Internal Medicine

## 2014-04-25 ENCOUNTER — Other Ambulatory Visit: Payer: Self-pay | Admitting: Internal Medicine

## 2014-05-05 ENCOUNTER — Encounter: Payer: Self-pay | Admitting: Internal Medicine

## 2014-05-05 ENCOUNTER — Ambulatory Visit (INDEPENDENT_AMBULATORY_CARE_PROVIDER_SITE_OTHER): Payer: Medicare Other | Admitting: Internal Medicine

## 2014-05-05 VITALS — BP 138/83 | HR 74 | Temp 97.9°F | Ht 62.0 in | Wt 135.1 lb

## 2014-05-05 DIAGNOSIS — M069 Rheumatoid arthritis, unspecified: Secondary | ICD-10-CM

## 2014-05-05 DIAGNOSIS — F0390 Unspecified dementia without behavioral disturbance: Secondary | ICD-10-CM

## 2014-05-05 DIAGNOSIS — Z23 Encounter for immunization: Secondary | ICD-10-CM

## 2014-05-05 DIAGNOSIS — Z0000 Encounter for general adult medical examination without abnormal findings: Secondary | ICD-10-CM

## 2014-05-05 DIAGNOSIS — E7800 Pure hypercholesterolemia, unspecified: Secondary | ICD-10-CM

## 2014-05-05 DIAGNOSIS — M810 Age-related osteoporosis without current pathological fracture: Secondary | ICD-10-CM

## 2014-05-05 DIAGNOSIS — D649 Anemia, unspecified: Secondary | ICD-10-CM

## 2014-05-05 NOTE — Progress Notes (Signed)
Pre visit review using our clinic review tool, if applicable. No additional management support is needed unless otherwise documented below in the visit note. 

## 2014-05-08 ENCOUNTER — Encounter: Payer: Self-pay | Admitting: Internal Medicine

## 2014-05-08 DIAGNOSIS — Z0000 Encounter for general adult medical examination without abnormal findings: Secondary | ICD-10-CM | POA: Insufficient documentation

## 2014-05-08 NOTE — Assessment & Plan Note (Signed)
Last hgb wnl.  Being followed by Dr Gavin Potters.

## 2014-05-08 NOTE — Assessment & Plan Note (Signed)
Has been on forteo, fosamax and reclast.  Continue calcium and vitamin D.  Continue weight bearing exercise.  Follow vitamin D level.

## 2014-05-08 NOTE — Assessment & Plan Note (Signed)
On MTX.  Sees Dr Gavin Potters.  Stable.  He follows her labs.

## 2014-05-08 NOTE — Progress Notes (Signed)
Patient ID: Jillian Horn, female   DOB: [birth year], 81 y.o.   MRN: [id number]   Subjective:    Patient ID: Jillian Horn, female    DOB: [birth year], 81 y.o.   MRN: [id number]  HPI  Patient here for her physical exam.  Accompanied by her son.  History obtained from both of them.  They both state she is doing relatively well.  Memory stable.  On aricept and namenda.  Joints stable.  Sees Dr Gavin Potters.  On MTX.  He follows labs.  No cardiac symptoms with increased activity or exertion.  Breathing stable.  Bowels stable.     Past Medical History  Diagnosis Date  . Rheumatoid arthritis(714.0)     seropositive, oligo-articular.  MTX, s/p plaquenil, cytopenia on sulfasalazine, s/p remicade  . Osteoporosis     s/p fosamax, s/p forteo, reclast  . Diverticulosis   . Anemia     erosion on EGD  . Hiatal hernia   . Hypercholesterolemia   . Dementia     Current Outpatient Prescriptions on File Prior to Visit  Medication Sig Dispense Refill  . amLODipine (NORVASC) 2.5 MG tablet TAKE 1 TABLET BY MOUTH ONCE DAILY. 30 tablet 6  . CALCIUM 600/VITAMIN D3 600-800 MG-UNIT TABS TAKE 1 TABLET BY MOUTH TWICE DAILY 60 tablet 1  . clopidogrel (PLAVIX) 75 MG tablet TAKE 1 TABLET BY MOUTH ONCE DAILY. 30 tablet 6  . donepezil (ARICEPT) 10 MG tablet TAKE 1 TABLET BY MOUTH ONCE DAILY. 30 tablet 3  . escitalopram (LEXAPRO) 20 MG tablet TAKE 1 TABLET BY MOUTH ONCE DAILY. 30 tablet 3  . ferrous sulfate 325 (65 FE) MG tablet TAKE 1 TABLET BY MOUTH TWICE DAILY 60 tablet 6  . folic acid (FOLVITE) 1 MG tablet Take 1 mg by mouth daily.    . GNP ARTHRITIS PAIN RELIEF 650 MG CR tablet TAKE (2) TABLETS BY MOUTH TWICE DAILY. 120 tablet 6  . memantine (NAMENDA) 10 MG tablet TAKE 1 TABLET BY MOUTH TWICE DAILY 60 tablet 3  . methotrexate (RHEUMATREX) 2.5 MG tablet Take 2.5 mg by mouth once a week. Caution:Chemotherapy. Protect from light.    Marland Kitchen omeprazole (PRILOSEC) 20 MG capsule TAKE 1 TABLET BY MOUTH ONCE DAILY. 30  capsule 6  . pravastatin (PRAVACHOL) 40 MG tablet TAKE 1 TABLET BY MOUTH AT BEDTIME FOR CHOLESTEROL 30 tablet 6  . sucralfate (CARAFATE) 1 G tablet TAKE (1) TABLET BY MOUTH FOUR TIMES DAILY. 120 tablet 3   No current facility-administered medications on file prior to visit.    Review of Systems  Constitutional: Negative for appetite change and unexpected weight change.  HENT: Negative for congestion and sinus pressure.   Respiratory: Negative for cough, chest tightness and shortness of breath.   Cardiovascular: Negative for chest pain, palpitations and leg swelling.  Gastrointestinal: Negative for nausea, vomiting, abdominal pain and diarrhea.  Genitourinary: Negative for dysuria and difficulty urinating.  Musculoskeletal: Negative for joint swelling.       Joints stable.    Skin: Negative for color change and rash.  Neurological: Negative for dizziness, light-headedness and headaches.  Psychiatric/Behavioral: Negative for behavioral problems and agitation.       Objective:    Physical Exam  Constitutional: She is oriented to person, place, and time. She appears well-developed and well-nourished.  HENT:  Nose: Nose normal.  Mouth/Throat: Oropharynx is clear and moist.  Eyes: Right eye exhibits no discharge. Left eye exhibits no discharge. No scleral icterus.  Neck: Neck supple. No  thyromegaly present.  Cardiovascular: Normal rate and regular rhythm.   Pulmonary/Chest: Breath sounds normal. No accessory muscle usage. No tachypnea. No respiratory distress. She has no decreased breath sounds. She has no wheezes. She has no rhonchi. Right breast exhibits no inverted nipple, no mass, no nipple discharge and no tenderness (no axillary adenopathy). Left breast exhibits no inverted nipple, no mass, no nipple discharge and no tenderness (no axilarry adenopathy).  Abdominal: Soft. Bowel sounds are normal. There is no tenderness.  Musculoskeletal: She exhibits no edema or tenderness.    Lymphadenopathy:    She has no cervical adenopathy.  Neurological: She is alert and oriented to person, place, and time.  Skin: Skin is warm. No rash noted.  Psychiatric: She has a normal mood and affect. Her behavior is normal.    BP 138/83 mmHg  Pulse 74  Temp(Src) 97.9 F (36.6 C) (Oral)  Ht 5\' 2"  (1.575 m)  Wt 135 lb 2 oz (61.292 kg)  BMI 24.71 kg/m2  SpO2 97% Wt Readings from Last 3 Encounters:  05/05/14 135 lb 2 oz (61.292 kg)  01/06/14 137 lb (62.143 kg)  04/26/13 132 lb 12 oz (60.215 kg)     No results found for: WBC, HGB, HCT, PLT, GLUCOSE, CHOL, TRIG, HDL, LDLDIRECT, LDLCALC, ALT, AST, NA, K, CL, CREATININE, BUN, CO2, TSH, PSA, INR, GLUF, HGBA1C, MICROALBUR      Assessment & Plan:   Problem List Items Addressed This Visit    Anemia    Last hgb wnl.  Being followed by Dr 06/24/13.        Dementia    On namenda and aricept.  Stable.  Follow.        Health care maintenance    Physical today (05/05/14).  She declines mammogram.        Hypercholesterolemia    Low cholesterol diet and exercise.  On pravastatin.  Follow lipid panel and liver function tests.        Osteoporosis    Has been on forteo, fosamax and reclast.  Continue calcium and vitamin D.  Continue weight bearing exercise.  Follow vitamin D level.        Rheumatoid arthritis    On MTX.  Sees Dr 05/07/14.  Stable.  He follows her labs.         Other Visit Diagnoses    Encounter for immunization    -  Primary      I spent 25 minutes with the patient and more than 50% of the time was spent in consultation regarding the above.     Gavin Potters, MD

## 2014-05-08 NOTE — Assessment & Plan Note (Addendum)
Physical today (05/05/14).  She declines mammogram.

## 2014-05-08 NOTE — Assessment & Plan Note (Signed)
On namenda and aricept.  Stable.  Follow.

## 2014-05-08 NOTE — Assessment & Plan Note (Signed)
Low cholesterol diet and exercise.  On pravastatin.  Follow lipid panel and liver function tests.   

## 2014-05-18 ENCOUNTER — Encounter: Payer: Self-pay | Admitting: Internal Medicine

## 2014-05-25 ENCOUNTER — Other Ambulatory Visit: Payer: Self-pay | Admitting: Internal Medicine

## 2014-07-21 LAB — LIPID PANEL
Cholesterol: 209 mg/dL — AB (ref 0–200)
HDL: 46 mg/dL (ref 35–70)
LDL Cholesterol: 139 mg/dL
Triglycerides: 119 mg/dL (ref 40–160)

## 2014-07-21 LAB — HEPATIC FUNCTION PANEL
ALK PHOS: 52 U/L (ref 25–125)
ALT: 6 U/L — AB (ref 7–35)
AST: 10 U/L — AB (ref 13–35)
Bilirubin, Total: 0.6 mg/dL

## 2014-07-21 LAB — BASIC METABOLIC PANEL: Creatinine: 0.8 mg/dL (ref 0.5–1.1)

## 2014-07-22 ENCOUNTER — Encounter: Payer: Self-pay | Admitting: Internal Medicine

## 2014-11-03 ENCOUNTER — Ambulatory Visit (INDEPENDENT_AMBULATORY_CARE_PROVIDER_SITE_OTHER): Payer: Medicare Other | Admitting: Internal Medicine

## 2014-11-03 ENCOUNTER — Encounter: Payer: Self-pay | Admitting: Internal Medicine

## 2014-11-03 VITALS — BP 120/70 | HR 77 | Temp 98.3°F | Ht 62.0 in | Wt 126.4 lb

## 2014-11-03 DIAGNOSIS — M069 Rheumatoid arthritis, unspecified: Secondary | ICD-10-CM

## 2014-11-03 DIAGNOSIS — D649 Anemia, unspecified: Secondary | ICD-10-CM

## 2014-11-03 DIAGNOSIS — E7800 Pure hypercholesterolemia, unspecified: Secondary | ICD-10-CM

## 2014-11-03 DIAGNOSIS — M810 Age-related osteoporosis without current pathological fracture: Secondary | ICD-10-CM

## 2014-11-03 DIAGNOSIS — Z91148 Patient's other noncompliance with medication regimen for other reason: Secondary | ICD-10-CM

## 2014-11-03 DIAGNOSIS — F0390 Unspecified dementia without behavioral disturbance: Secondary | ICD-10-CM

## 2014-11-03 DIAGNOSIS — Z9114 Patient's other noncompliance with medication regimen: Secondary | ICD-10-CM

## 2014-11-03 NOTE — Progress Notes (Signed)
Pre-visit discussion using our clinic review tool. No additional management support is needed unless otherwise documented below in the visit note.  

## 2014-11-03 NOTE — Progress Notes (Signed)
Patient ID: Jillian Horn, female   DOB: [birth year], 81 y.o.   MRN: [id number]   Subjective:    Patient ID: Jillian Horn, female    DOB: August 30, 1929, 81 y.o.   MRN: [id number]  HPI  Patient here for a scheduled follow up.  She is accompanied by her son.  History obtained from both of them.  Son reports she is stable.  Memory stable.  Does not feel needs anything more at this time.  She is not taking her medication.  She states that she "does not need them".  Discussed this at length today.  States she will start taking them.  Pts other son distributes them to her.  Has not taken in a while.  No cardiac symptoms with increased activity or exertion.  No sob.  Joints stable.  Sees Dr Gavin Potters.  Bowels stable.     Past Medical History  Diagnosis Date  . Rheumatoid arthritis(714.0)     seropositive, oligo-articular.  MTX, s/p plaquenil, cytopenia on sulfasalazine, s/p remicade  . Osteoporosis     s/p fosamax, s/p forteo, reclast  . Diverticulosis   . Anemia     erosion on EGD  . Hiatal hernia   . Hypercholesterolemia   . Dementia    Past Surgical History  Procedure Laterality Date  . Skin cancer excision    . Kyphoplasty      thoracic spine  . Breast biopsy    . Abdominal hysterectomy      partial  . Appendectomy     No family history on file. Social History   Social History  . Marital Status: Married    Spouse Name: N/A  . Number of Children: N/A  . Years of Education: N/A   Social History Main Topics  . Smoking status: Never Smoker   . Smokeless tobacco: Never Used  . Alcohol Use: No  . Drug Use: No  . Sexual Activity: Not Asked   Other Topics Concern  . None   Social History Narrative    Outpatient Encounter Prescriptions as of 11/03/2014  Medication Sig  . amLODipine (NORVASC) 2.5 MG tablet TAKE 1 TABLET BY MOUTH ONCE DAILY.  Marland Kitchen CALCIUM 600/VITAMIN D3 600-800 MG-UNIT TABS TAKE 1 TABLET BY MOUTH TWICE DAILY  . clopidogrel (PLAVIX) 75 MG tablet TAKE 1 TABLET  BY MOUTH ONCE DAILY.  Marland Kitchen donepezil (ARICEPT) 10 MG tablet TAKE 1 TABLET BY MOUTH ONCE DAILY.  Marland Kitchen escitalopram (LEXAPRO) 20 MG tablet TAKE 1 TABLET BY MOUTH ONCE DAILY.  . ferrous sulfate 325 (65 FE) MG tablet TAKE 1 TABLET BY MOUTH TWICE DAILY  . folic acid (FOLVITE) 1 MG tablet Take 1 mg by mouth daily.  . GNP ARTHRITIS PAIN RELIEF 650 MG CR tablet TAKE (2) TABLETS BY MOUTH TWICE DAILY.  . memantine (NAMENDA) 10 MG tablet TAKE 1 TABLET BY MOUTH TWICE DAILY  . methotrexate (RHEUMATREX) 2.5 MG tablet Take 2.5 mg by mouth once a week. Caution:Chemotherapy. Protect from light.  Marland Kitchen omeprazole (PRILOSEC) 20 MG capsule TAKE 1 TABLET BY MOUTH ONCE DAILY.  . pravastatin (PRAVACHOL) 40 MG tablet TAKE 1 TABLET BY MOUTH AT BEDTIME FOR CHOLESTEROL  . sucralfate (CARAFATE) 1 G tablet TAKE (1) TABLET BY MOUTH FOUR TIMES DAILY.   No facility-administered encounter medications on file as of 11/03/2014.    Review of Systems  Constitutional: Negative for appetite change and unexpected weight change.  HENT: Negative for congestion and sinus pressure.   Eyes: Negative for pain and discharge.  Respiratory: Negative for cough, chest tightness and shortness of breath.   Cardiovascular: Negative for chest pain, palpitations and leg swelling.  Gastrointestinal: Negative for nausea, vomiting, abdominal pain and diarrhea.  Genitourinary: Negative for dysuria and difficulty urinating.  Musculoskeletal:       Followed by Dr Gavin Potters for her arthritis.    Skin: Negative for color change and rash.  Neurological: Negative for dizziness, light-headedness and headaches.  Psychiatric/Behavioral: Negative for dysphoric mood and agitation.       Objective:    Physical Exam  Constitutional: She appears well-developed and well-nourished. No distress.  HENT:  Nose: Nose normal.  Mouth/Throat: Oropharynx is clear and moist.  Eyes: Conjunctivae are normal. Right eye exhibits no discharge. Left eye exhibits no discharge.    Neck: Neck supple. No thyromegaly present.  Cardiovascular: Normal rate and regular rhythm.   Pulmonary/Chest: Breath sounds normal. No respiratory distress. She has no wheezes.  Abdominal: Soft. Bowel sounds are normal. There is no tenderness.  Musculoskeletal: She exhibits no edema or tenderness.  Lymphadenopathy:    She has no cervical adenopathy.  Skin: No rash noted. No erythema.  Psychiatric: She has a normal mood and affect. Her behavior is normal.    BP 120/70 mmHg  Pulse 77  Temp(Src) 98.3 F (36.8 C) (Oral)  Ht 5\' 2"  (1.575 m)  Wt 126 lb 6 oz (57.323 kg)  BMI 23.11 kg/m2  SpO2 96% Wt Readings from Last 3 Encounters:  11/03/14 126 lb 6 oz (57.323 kg)  05/05/14 135 lb 2 oz (61.292 kg)  01/06/14 137 lb (62.143 kg)     Lab Results  Component Value Date   WBC 8.5 03/06/2014   HGB 13.2 03/06/2014   HCT 40.3 03/06/2014   PLT 254 03/06/2014   GLUCOSE 97 03/06/2014   CHOL 209* 07/21/2014   TRIG 119 07/21/2014   HDL 46 07/21/2014   LDLCALC 139 07/21/2014   ALT 6* 07/21/2014   AST 10* 07/21/2014   NA 142 03/06/2014   K 4.4 03/06/2014   CL 110* 03/06/2014   CREATININE 0.8 07/21/2014   BUN 11 03/06/2014   CO2 24 03/06/2014       Assessment & Plan:   Problem List Items Addressed This Visit    Anemia    Labs followed by Dr 03/08/2014.  Last hgb wnl.        Dementia - Primary    Restart namenda and aricept.  Son reports - stable.  Follow.        Hypercholesterolemia    Low cholesterol diet and exercise.  Follow lipid panel.  LDL just checked 139.  Restart medication.  She has not been taking the medication regularly.        Noncompliance with medications    Discussed the need to start taking her medication on a regular basis.  Son to distribute the medication.   Blood pressure doing well.  Off amlodipine.  Follow off medication.       Osteoporosis    Has been on forteo, fosamax and reclast.  Restart calcium and vitamin D.  Continue weight bearing  exercise.  Follow vitamin D level.        Rheumatoid arthritis    Followed by Dr Gavin Potters.  Stable.           Gavin Potters, MD

## 2014-11-08 ENCOUNTER — Encounter: Payer: Self-pay | Admitting: Internal Medicine

## 2014-11-08 DIAGNOSIS — Z9114 Patient's other noncompliance with medication regimen: Secondary | ICD-10-CM | POA: Insufficient documentation

## 2014-11-08 NOTE — Assessment & Plan Note (Signed)
Labs followed by Dr Gavin Potters.  Last hgb wnl.

## 2014-11-08 NOTE — Assessment & Plan Note (Signed)
Low cholesterol diet and exercise.  Follow lipid panel.  LDL just checked 139.  Restart medication.  She has not been taking the medication regularly.

## 2014-11-08 NOTE — Assessment & Plan Note (Signed)
Restart namenda and aricept.  Son reports - stable.  Follow.

## 2014-11-08 NOTE — Assessment & Plan Note (Addendum)
Discussed the need to start taking her medication on a regular basis.  Son to distribute the medication.   Blood pressure doing well.  Off amlodipine.  Follow off medication.

## 2014-11-08 NOTE — Assessment & Plan Note (Signed)
Followed by Dr [facility]ern[facility]le.  Stable.  

## 2014-11-08 NOTE — Assessment & Plan Note (Signed)
Has been on forteo, fosamax and reclast.  Restart calcium and vitamin D.  Continue weight bearing exercise.  Follow vitamin D level.

## 2015-04-05 ENCOUNTER — Telehealth: Payer: Self-pay | Admitting: Internal Medicine

## 2015-04-05 NOTE — Telephone Encounter (Signed)
Called pt's son back and made an appointment for Friday./tvw

## 2015-04-05 NOTE — Telephone Encounter (Signed)
Pt son called stating she is going into a nursing facility and needs a TB test. Has pt had one recently before I schedule? Let me know. Call son @ [phone]. Thank You!

## 2015-04-05 NOTE — Telephone Encounter (Signed)
Called Pt son back LMOM.Maeola Sarah

## 2015-04-06 ENCOUNTER — Ambulatory Visit: Payer: Medicare Other

## 2015-04-06 NOTE — Telephone Encounter (Signed)
Ok. It's done. Pt is rescheduled for 1/24 @ 9am

## 2015-04-06 NOTE — Telephone Encounter (Signed)
Jillian Horn, can you please call and let this patient know they can't have this done today, it would have to be read on Saturday and we are not here that day.  It will need to be rescheduled for Monday at the earliest.  Thanks  It is a TB SKin test scheduled for today at 330.

## 2015-04-11 ENCOUNTER — Ambulatory Visit (INDEPENDENT_AMBULATORY_CARE_PROVIDER_SITE_OTHER): Payer: Medicare Other

## 2015-04-11 ENCOUNTER — Telehealth: Payer: Self-pay | Admitting: Internal Medicine

## 2015-04-11 DIAGNOSIS — Z111 Encounter for screening for respiratory tuberculosis: Secondary | ICD-10-CM

## 2015-04-11 NOTE — Telephone Encounter (Signed)
Please advise 

## 2015-04-11 NOTE — Progress Notes (Signed)
Patient came in for PPD placement, placed on Left arm.  Will return on Thursday for reading and bring any paperwork needed.

## 2015-04-11 NOTE — Telephone Encounter (Signed)
Pt's son, [name] dropped off [facility] FL2 Form to be filled out by Dr. Lorin Picket. Paper is in Dr. Roby Lofts box.

## 2015-04-11 NOTE — Telephone Encounter (Signed)
Form placed in red folder  

## 2015-04-13 ENCOUNTER — Ambulatory Visit: Payer: Medicare Other

## 2015-04-13 DIAGNOSIS — Z7689 Persons encountering health services in other specified circumstances: Secondary | ICD-10-CM

## 2015-04-13 DIAGNOSIS — Z111 Encounter for screening for respiratory tuberculosis: Secondary | ICD-10-CM

## 2015-04-13 LAB — TB SKIN TEST
Induration: 0 mm
TB SKIN TEST: NEGATIVE

## 2015-04-13 NOTE — Telephone Encounter (Signed)
Pt daughter in law called to check on the status of the FL2 form... Please advise Clydie Braun @ [phone]

## 2015-04-13 NOTE — Telephone Encounter (Signed)
Once TB skin test is read, please fax results over to [facility] [phone]

## 2015-04-13 NOTE — Telephone Encounter (Signed)
I spoke with daughter & advised her that we have up to 7 days to complete forms. However, we will try to have it completed sooner. Form was just received Tuesday afternoon & Dr. Lorin Picket has been out of office a half day on Wed & today. Daughter states that she was never notified of this & wish someone would have told them when it was dropped off. Until the form is received she states they will have to pick her up from the home every day.

## 2015-04-13 NOTE — Telephone Encounter (Signed)
Pt daughter in law called back to check on the status.. Informed once again that Archie Patten will call her back.. Please advise Clydie Braun @[phone]

## 2015-04-13 NOTE — Telephone Encounter (Signed)
Completed request.  

## 2015-04-13 NOTE — Progress Notes (Signed)
Patient came in for PPD reading.  Results noted.  Completed on FL-2 form.  Informed patient that the form is still with Provider to be completed.  We will call when form is ready to be picked up.

## 2015-04-13 NOTE — Telephone Encounter (Signed)
Will complete 

## 2015-04-14 NOTE — Telephone Encounter (Signed)
Form faxed to [facility] & daughter Clydie Braun) aware.

## 2015-04-14 NOTE — Telephone Encounter (Signed)
Form completed.  Placed in basket.  Just need to clarify a few medicatoins.  See note on form.

## 2015-04-18 ENCOUNTER — Emergency Department
Admission: EM | Admit: 2015-04-18 | Discharge: 2015-04-18 | Disposition: A | Discharge status: Home / Self Care | Payer: Medicare Other | Attending: Emergency Medicine | Admitting: Emergency Medicine

## 2015-04-18 ENCOUNTER — Emergency Department: Payer: Medicare Other

## 2015-04-18 DIAGNOSIS — R451 Restlessness and agitation: Secondary | ICD-10-CM | POA: Diagnosis present

## 2015-04-18 DIAGNOSIS — R4182 Altered mental status, unspecified: Secondary | ICD-10-CM | POA: Insufficient documentation

## 2015-04-18 DIAGNOSIS — F329 Major depressive disorder, single episode, unspecified: Secondary | ICD-10-CM | POA: Insufficient documentation

## 2015-04-18 DIAGNOSIS — N390 Urinary tract infection, site not specified: Secondary | ICD-10-CM | POA: Diagnosis not present

## 2015-04-18 LAB — CBC WITH DIFFERENTIAL/PLATELET
BASOS ABS: 0 10*3/uL (ref 0–0.1)
Basophils Relative: 1 %
EOS PCT: 0 %
Eosinophils Absolute: 0 10*3/uL (ref 0–0.7)
HCT: 36.9 % (ref 35.0–47.0)
HEMOGLOBIN: 12.3 g/dL (ref 12.0–16.0)
LYMPHS PCT: 7 %
Lymphs Abs: 0.5 10*3/uL — ABNORMAL LOW (ref 1.0–3.6)
MCH: 32 pg (ref 26.0–34.0)
MCHC: 33.2 g/dL (ref 32.0–36.0)
MCV: 96.5 fL (ref 80.0–100.0)
Monocytes Absolute: 0.5 10*3/uL (ref 0.2–0.9)
Monocytes Relative: 7 %
NEUTROS PCT: 85 %
Neutro Abs: 6.1 10*3/uL (ref 1.4–6.5)
PLATELETS: 265 10*3/uL (ref 150–440)
RBC: 3.83 MIL/uL (ref 3.80–5.20)
RDW: 14.4 % (ref 11.5–14.5)
WBC: 7.1 10*3/uL (ref 3.6–11.0)

## 2015-04-18 LAB — COMPREHENSIVE METABOLIC PANEL
ALK PHOS: 38 U/L (ref 38–126)
ALT: 6 U/L — AB (ref 14–54)
AST: 12 U/L — AB (ref 15–41)
Albumin: 3.3 g/dL — ABNORMAL LOW (ref 3.5–5.0)
Anion gap: 6 (ref 5–15)
BUN: 12 mg/dL (ref 6–20)
CHLORIDE: 107 mmol/L (ref 101–111)
CO2: 26 mmol/L (ref 22–32)
CREATININE: 0.9 mg/dL (ref 0.44–1.00)
Calcium: 9 mg/dL (ref 8.9–10.3)
GFR calc Af Amer: >60 mL/min (ref >60)
GFR, EST NON AFRICAN AMERICAN: 58 mL/min — AB (ref >60)
Glucose, Bld: 118 mg/dL — ABNORMAL HIGH (ref 65–99)
Potassium: 3.9 mmol/L (ref 3.5–5.1)
SODIUM: 139 mmol/L (ref 135–145)
Total Bilirubin: 0.7 mg/dL (ref 0.3–1.2)
Total Protein: 6.6 g/dL (ref 6.5–8.1)

## 2015-04-18 LAB — URINALYSIS COMPLETE WITH MICROSCOPIC (ARMC ONLY)
Bilirubin Urine: NEGATIVE
Glucose, UA: NEGATIVE mg/dL
HGB URINE DIPSTICK: NEGATIVE
Nitrite: NEGATIVE
PH: 6 (ref 5.0–8.0)
PROTEIN: 100 mg/dL — AB
Specific Gravity, Urine: 1.023 (ref 1.005–1.030)

## 2015-04-18 LAB — TROPONIN I: Troponin I: <0.03 ng/mL (ref <0.031)

## 2015-04-18 MED ORDER — DEXTROSE 5 % IV SOLN
1.0000 g | Freq: Once | INTRAVENOUS | Status: AC
  Administered 2015-04-18: 1 g via INTRAVENOUS
  Filled 2015-04-18: qty 10

## 2015-04-18 MED ORDER — NITROFURANTOIN MONOHYD MACRO 100 MG PO CAPS: 100.0000 mg | ORAL_CAPSULE | Freq: Two times a day (BID) | ORAL | Status: DC

## 2015-04-18 MED ORDER — LORAZEPAM 0.5 MG PO TABS: 0.5000 mg | ORAL_TABLET | Freq: Three times a day (TID) | ORAL | Status: DC | PRN

## 2015-04-18 MED ORDER — ONDANSETRON HCL 4 MG/2ML IJ SOLN
4.0000 mg | Freq: Once | INTRAMUSCULAR | Status: AC
  Administered 2015-04-18: 4 mg via INTRAVENOUS
  Filled 2015-04-18: qty 2

## 2015-04-18 NOTE — ED Notes (Signed)
Report called to [facility]. Pt waiting for EMS to bring her back to facility.

## 2015-04-18 NOTE — Discharge Instructions (Signed)

## 2015-04-18 NOTE — ED Provider Notes (Signed)
[facility] Emergency Department Provider Note   L5 caveat: Review of systems and history is limited by dementia  Time seen: ----------------------------------------- 12:03 PM on 04/18/2015 -----------------------------------------    I have reviewed the triage vital signs and the nursing notes.   HISTORY  Chief Complaint Seizures    HPI Jillian Horn is a 82 y.o. female who presents ER from [facility] via EMS. Staff reports she did not look well this morning and began trembling. She wasn't incontinent, they're concerned she may have had a seizure. Patient's family reports she recently moved [facility] and there are suspecting she got agitated. Patient denies any complaints other than that she feels "lousy". They have not noted any recent illnesses.   Past Medical History  Diagnosis Date  . Rheumatoid arthritis(714.0)     seropositive, oligo-articular.  MTX, s/p plaquenil, cytopenia on sulfasalazine, s/p remicade  . Osteoporosis     s/p fosamax, s/p forteo, reclast  . Diverticulosis   . Anemia     erosion on EGD  . Hiatal hernia   . Hypercholesterolemia   . Dementia     Patient Active Problem List   Diagnosis Date Noted  . Noncompliance with medications 11/08/2014  . Health care maintenance 05/08/2014  . Rheumatoid arthritis (HCC) 08/10/2012  . Anemia 08/10/2012  . Osteoporosis 08/10/2012  . Hypercholesterolemia 08/10/2012  . Dementia 08/10/2012  . Hiatal hernia 08/10/2012    Past Surgical History  Procedure Laterality Date  . Skin cancer excision    . Kyphoplasty      thoracic spine  . Breast biopsy    . Abdominal hysterectomy      partial  . Appendectomy      Allergies Codeine sulfate; Lidocaine; Sulfa antibiotics; and Sulfasalazine  Social History Social History  Substance Use Topics  . Smoking status: Never Smoker   . Smokeless tobacco: Never Used  . Alcohol Use: No    Review of Systems Negative or  otherwise unknown ____________________________________________   PHYSICAL EXAM:  VITAL SIGNS: ED Triage Vitals  Enc Vitals Group     BP 04/18/15 1201 129/72 mmHg     Pulse Rate 04/18/15 1201 61     Resp 04/18/15 1201 17     Temp 04/18/15 1201 97.9 F (36.6 C)     Temp Source 04/18/15 1201 Oral     SpO2 04/18/15 1201 97 %     Weight --      Height --      Head Cir --      Peak Flow --      Pain Score --      Pain Loc --      Pain Edu? --      Excl. in GC? --     Constitutional: Alert but disoriented. Cachectic, no acute distress Eyes: Conjunctivae are normal. PERRL. Normal extraocular movements. ENT   Head: Normocephalic and atraumatic.   Nose: No congestion/rhinnorhea.   Mouth/Throat: Mucous membranes are moist.   Neck: No stridor. Cardiovascular: Normal rate, regular rhythm. Normal and symmetric distal pulses are present in all extremities. No murmurs, rubs, or gallops. Respiratory: Normal respiratory effort without tachypnea nor retractions. Breath sounds are clear and equal bilaterally. No wheezes/rales/rhonchi. Gastrointestinal: Soft and nontender. No distention. No abdominal bruits.  Musculoskeletal: Nontender with normal range of motion in all extremities. No joint effusions.  No lower extremity tenderness nor edema. Neurologic:  Normal speech and language. No gross focal neurologic deficits are appreciated. Speech is normal.  Skin:  Skin is warm, dry and intact. No rash noted. Psychiatric: Mood and affect are depressed ____________________________________________  EKG: Interpreted by me. Normal sinus rhythm with a rate of 62 bpm, normal PR interval, normal QRS, normal QT interval. Grossly unremarkable EKG.  ____________________________________________  ED COURSE:  Pertinent labs & imaging results that were available during my care of the patient were reviewed by me and considered in my medical decision making (see chart for details). Patient is no  acute distress, no postictal phase. I doubt seizure event. ____________________________________________    LABS (pertinent positives/negatives)  Labs Reviewed  CBC WITH DIFFERENTIAL/PLATELET - Abnormal; Notable for the following:    Lymphs Abs 0.5 (*)    All other components within normal limits  COMPREHENSIVE METABOLIC PANEL - Abnormal; Notable for the following:    Glucose, Bld 118 (*)    Albumin 3.3 (*)    AST 12 (*)    ALT 6 (*)    GFR calc non Af Amer 58 (*)    All other components within normal limits  URINALYSIS COMPLETEWITH MICROSCOPIC ([facility] ONLY) - Abnormal; Notable for the following:    Color, Urine YELLOW (*)    APPearance CLEAR (*)    Ketones, ur 1+ (*)    Protein, ur 100 (*)    Leukocytes, UA 2+ (*)    Bacteria, UA MANY (*)    Squamous Epithelial / LPF 0-5 (*)    All other components within normal limits  TROPONIN I    RADIOLOGY Images were viewed by me  CT head IMPRESSION: No acute intracranial abnormality. Mild cerebral atrophy. Atherosclerotic calcifications of carotid siphon. No definite acute cortical infarction. Mild periventricular and patchy subcortical white matter decreased attenuation probable due to chronic small vessel ischemic changes. ____________________________________________  FINAL ASSESSMENT AND PLAN  Altered mental status, UTI  Plan: Patient with labs and imaging as dictated above. Patient is given Rocephin to cover for UTI. I suspect she got agitated due to being in a new environment. She is in no acute distress, she stable to return to the nursing home.   Emily Filbert, MD   Emily Filbert, MD 04/18/15 [phone]

## 2015-04-18 NOTE — ED Notes (Signed)
Pt came to ED from [facility] via EMS. Staff reports this morning that pt did not look well and began tremoring. Pt was incontinent. Pt does not have history of seizures. Pt just moved into [facility] Friday and pts famly report that pt may be having a rough transition and may have got agitated.

## 2015-05-08 ENCOUNTER — Ambulatory Visit: Payer: Medicare Other | Admitting: Internal Medicine

## 2015-05-08 ENCOUNTER — Encounter: Payer: Self-pay | Admitting: *Deleted

## 2015-07-05 ENCOUNTER — Emergency Department
Admission: EM | Admit: 2015-07-05 | Discharge: 2015-07-05 | Disposition: A | Discharge status: Home / Self Care | Payer: Medicare Other | Attending: Student | Admitting: Student

## 2015-07-05 ENCOUNTER — Encounter: Payer: Self-pay | Admitting: Emergency Medicine

## 2015-07-05 DIAGNOSIS — Z79899 Other long term (current) drug therapy: Secondary | ICD-10-CM | POA: Insufficient documentation

## 2015-07-05 DIAGNOSIS — F0390 Unspecified dementia without behavioral disturbance: Secondary | ICD-10-CM | POA: Insufficient documentation

## 2015-07-05 DIAGNOSIS — Z043 Encounter for examination and observation following other accident: Secondary | ICD-10-CM | POA: Diagnosis not present

## 2015-07-05 DIAGNOSIS — E7800 Pure hypercholesterolemia, unspecified: Secondary | ICD-10-CM | POA: Insufficient documentation

## 2015-07-05 DIAGNOSIS — M810 Age-related osteoporosis without current pathological fracture: Secondary | ICD-10-CM | POA: Insufficient documentation

## 2015-07-05 DIAGNOSIS — M069 Rheumatoid arthritis, unspecified: Secondary | ICD-10-CM | POA: Diagnosis not present

## 2015-07-05 DIAGNOSIS — W1839XA Other fall on same level, initial encounter: Secondary | ICD-10-CM | POA: Diagnosis not present

## 2015-07-05 DIAGNOSIS — D649 Anemia, unspecified: Secondary | ICD-10-CM | POA: Insufficient documentation

## 2015-07-05 DIAGNOSIS — W19XXXA Unspecified fall, initial encounter: Secondary | ICD-10-CM

## 2015-07-05 NOTE — ED Notes (Signed)
Patient presents to the ED via [facility] from [facility].  Patient was found by staff sitting on the floor in her bedroom.  Patient denies any pain.  Patient is confused with history of dementia.  Patient is alert and oriented to self only.  Patient is smiling and pleasant, no obvious distress at this time.

## 2015-07-05 NOTE — ED Notes (Signed)
Registration in room to register patient at this time.

## 2015-07-05 NOTE — ED Notes (Signed)
Patient drinking coffee at this time.

## 2015-07-05 NOTE — ED Provider Notes (Signed)
[facility] Emergency Department Provider Note  ____________________________________________  Time seen: Approximately 8:10 AM  I have reviewed the triage vital signs and the nursing notes.   HISTORY  Chief Complaint Fall  Caveat - history of present illness and review of systems is limited due to the patient's dementia. Information is obtained from staff at [facility].  HPI Jillian Horn is a 82 y.o. female with history of dementia, hyperlipidemia, hypertension who presents from [facility] for a possible fall this morning. Staff reports that at change of shift this morning, the new staff member went into her room and found the patient seated on the floor. She was awake and alert and did not seem to have hit her head but she was not sure about how she got to the floor. On EMS arrival, she was able to stand and had no pain complaints. Staff reports that she has otherwise been in her usual state of health without illness. No cough,  vomiting, diarrhea, fevers or chills. Patient reports that she feels well and she has no pain complaints at this time. She denies abdominal pain, back pain, chest pain specifically. She is not chronically anticoagulated.   Past Medical History  Diagnosis Date  . Rheumatoid arthritis(714.0)     seropositive, oligo-articular.  MTX, s/p plaquenil, cytopenia on sulfasalazine, s/p remicade  . Osteoporosis     s/p fosamax, s/p forteo, reclast  . Diverticulosis   . Anemia     erosion on EGD  . Hiatal hernia   . Hypercholesterolemia   . Dementia     Patient Active Problem List   Diagnosis Date Noted  . Noncompliance with medications 11/08/2014  . Health care maintenance 05/08/2014  . Rheumatoid arthritis (HCC) 08/10/2012  . Anemia 08/10/2012  . Osteoporosis 08/10/2012  . Hypercholesterolemia 08/10/2012  . Dementia 08/10/2012  . Hiatal hernia 08/10/2012    Past Surgical History  Procedure Laterality Date  . Skin cancer  excision    . Kyphoplasty      thoracic spine  . Breast biopsy    . Abdominal hysterectomy      partial  . Appendectomy      Current Outpatient Rx  Name  Route  Sig  Dispense  Refill  . acetaminophen (TYLENOL) 500 MG tablet   Oral   Take 500 mg by mouth 2 (two) times daily.         Marland Kitchen amLODipine (NORVASC) 2.5 MG tablet   Oral   Take 2.5 mg by mouth daily.         . Calcium Carb-Cholecalciferol (CALCIUM 600/VITAMIN D3) 600-800 MG-UNIT TABS   Oral   Take 1 tablet by mouth 2 (two) times daily.         . cyanocobalamin 500 MCG tablet   Oral   Take 500 mcg by mouth daily.         Marland Kitchen donepezil (ARICEPT) 10 MG tablet   Oral   Take 10 mg by mouth daily.         Marland Kitchen escitalopram (LEXAPRO) 20 MG tablet   Oral   Take 20 mg by mouth daily.         . folic acid (FOLVITE) 1 MG tablet   Oral   Take 1 mg by mouth daily.         Marland Kitchen LORazepam (ATIVAN) 0.5 MG tablet   Oral   Take 1 tablet (0.5 mg total) by mouth every 8 (eight) hours as needed (agitation).   30  tablet   0   . memantine (NAMENDA) 10 MG tablet   Oral   Take 10 mg by mouth 2 (two) times daily.         . methotrexate (RHEUMATREX) 2.5 MG tablet   Oral   Take 10 mg by mouth once a week. (take on Wednesday) Caution:Chemotherapy. Protect from light.         Marland Kitchen omeprazole (PRILOSEC) 20 MG capsule   Oral   Take 20 mg by mouth daily.         . pravastatin (PRAVACHOL) 40 MG tablet   Oral   Take 40 mg by mouth daily.           Allergies Codeine sulfate; Lidocaine; Sulfa antibiotics; and Sulfasalazine  No family history on file.  Social History Social History  Substance Use Topics  . Smoking status: Never Smoker   . Smokeless tobacco: Never Used  . Alcohol Use: No    Review of Systems Constitutional: No fever/chills Eyes: No visual changes. ENT: No sore throat. Cardiovascular: Denies chest pain. Respiratory: Denies shortness of breath. Gastrointestinal: No abdominal pain.  No nausea,  no vomiting.  No diarrhea.  No constipation. Genitourinary: Negative for dysuria. Musculoskeletal: Negative for back pain. Skin: Negative for rash. Neurological: Negative for headaches, focal weakness or numbness.  10-point ROS otherwise negative.  ____________________________________________   PHYSICAL EXAM:  Filed Vitals:   07/05/15 0810  BP: 155/89  Pulse: 77  Temp: 98.7 F (37.1 C)  TempSrc: Oral  Resp: 16  Height: 5\' 5"  (1.651 m)  Weight: 143 lb (64.864 kg)  SpO2: 98%     Constitutional: Alert and oriented to self only, Not to place or year. Pleasantly demented, laughing, joking, requesting coffee. Well appearing and in no acute distress. Eyes: Conjunctivae are normal. PERRL. EOMI. Head: Atraumatic. Nose: No congestion/rhinnorhea. Mouth/Throat: Mucous membranes are moist.  Oropharynx non-erythematous. Neck: No stridor.  No cervical spine tenderness to palpation. Cardiovascular: Normal rate, regular rhythm. Grossly normal heart sounds.  Good peripheral circulation. Respiratory: Normal respiratory effort.  No retractions. Lungs CTAB. Gastrointestinal: Soft and nontender. No distention.  No CVA tenderness. Genitourinary: deferred Musculoskeletal: No lower extremity tenderness nor edema.  No joint effusions. No tenderness to palpation to the anterior chest wall, no flail chest or deformity. No midline tenderness to palpation throat the T or L-spine. Pelvis is stable to rock and compression. Full active painless range of motion at the large joints in the legs bilaterally. Neurologic:  Normal speech and language. No gross focal neurologic deficits are appreciated. No gait instability. Skin:  Skin is warm, dry and intact. No rash noted. Psychiatric: Mood and affect are normal. Speech and behavior are normal.  ____________________________________________   LABS (all labs ordered are listed, but only abnormal results are displayed)  Labs Reviewed - No data to  display ____________________________________________  EKG  ED ECG REPORT I, , the attending physician, personally viewed and interpreted this ECG.   Date: 07/05/2015  EKG Time: 08:85  Rate: 68  Rhythm: normal sinus rhythm  Axis: normal  Intervals:none  ST&T Change: No acute ST elevation. Normal QTC.  ____________________________________________  RADIOLOGY  none ____________________________________________   PROCEDURES  Procedure(s) performed: None  Critical Care performed: No  ____________________________________________   INITIAL IMPRESSION / ASSESSMENT AND PLAN / ED COURSE  Pertinent labs & imaging results that were available during my care of the patient were reviewed by me and considered in my medical decision making (see chart for details).  07/07/2015  Jillian Horn is a 82 y.o. female with history of dementia, hyperlipidemia, hypertension who presents from [facility] for a possible fall this morning. On exam, she is well-appearing and in no acute distress. She is awake and alert, oriented to self but that is her baseline. She has a completely atraumatic exam, her vital signs are stable, she is afebrile. She has no pain complaints. She is ambulating without any assistance. No indication for labs or imaging at this time. I discussed with her son, AERIANA SPEECE that we would send her back to [facility] and that she would need to follow up with a primary care doctor. I discussed this also with the staff at [facility] and they are comfortable with the plan. DC with return precautions. ____________________________________________   FINAL CLINICAL IMPRESSION(S) / ED DIAGNOSES  Final diagnoses:  Fall, initial encounter      Gayla Doss, MD 07/05/15 [phone]

## 2015-07-05 NOTE — ED Notes (Signed)
Patient placed on fall precautions.  Fall mat placed on floor and posey alarm placed on bed and attached to patient's shirt.

## 2015-09-15 ENCOUNTER — Emergency Department: Payer: Medicare Other

## 2015-09-15 ENCOUNTER — Encounter: Payer: Self-pay | Admitting: Emergency Medicine

## 2015-09-15 ENCOUNTER — Inpatient Hospital Stay
Admission: EM | Admit: 2015-09-15 | Discharge: 2015-09-18 | DRG: 470 | Disposition: A | Discharge status: Skilled Nursing Facility | Payer: Medicare Other | Attending: Internal Medicine | Admitting: Internal Medicine

## 2015-09-15 DIAGNOSIS — Z823 Family history of stroke: Secondary | ICD-10-CM | POA: Diagnosis not present

## 2015-09-15 DIAGNOSIS — Z96649 Presence of unspecified artificial hip joint: Secondary | ICD-10-CM

## 2015-09-15 DIAGNOSIS — F0390 Unspecified dementia without behavioral disturbance: Secondary | ICD-10-CM | POA: Diagnosis present

## 2015-09-15 DIAGNOSIS — N390 Urinary tract infection, site not specified: Secondary | ICD-10-CM | POA: Diagnosis present

## 2015-09-15 DIAGNOSIS — Y92129 Unspecified place in nursing home as the place of occurrence of the external cause: Secondary | ICD-10-CM | POA: Diagnosis not present

## 2015-09-15 DIAGNOSIS — M069 Rheumatoid arthritis, unspecified: Secondary | ICD-10-CM | POA: Diagnosis present

## 2015-09-15 DIAGNOSIS — Z882 Allergy status to sulfonamides status: Secondary | ICD-10-CM | POA: Diagnosis not present

## 2015-09-15 DIAGNOSIS — Z79899 Other long term (current) drug therapy: Secondary | ICD-10-CM | POA: Diagnosis not present

## 2015-09-15 DIAGNOSIS — E7800 Pure hypercholesterolemia, unspecified: Secondary | ICD-10-CM | POA: Diagnosis present

## 2015-09-15 DIAGNOSIS — M810 Age-related osteoporosis without current pathological fracture: Secondary | ICD-10-CM | POA: Diagnosis present

## 2015-09-15 DIAGNOSIS — S72001A Fracture of unspecified part of neck of right femur, initial encounter for closed fracture: Secondary | ICD-10-CM

## 2015-09-15 DIAGNOSIS — K449 Diaphragmatic hernia without obstruction or gangrene: Secondary | ICD-10-CM | POA: Diagnosis present

## 2015-09-15 DIAGNOSIS — S72002A Fracture of unspecified part of neck of left femur, initial encounter for closed fracture: Secondary | ICD-10-CM

## 2015-09-15 DIAGNOSIS — Z85828 Personal history of other malignant neoplasm of skin: Secondary | ICD-10-CM | POA: Diagnosis not present

## 2015-09-15 DIAGNOSIS — Z8249 Family history of ischemic heart disease and other diseases of the circulatory system: Secondary | ICD-10-CM | POA: Diagnosis not present

## 2015-09-15 DIAGNOSIS — W19XXXA Unspecified fall, initial encounter: Secondary | ICD-10-CM | POA: Diagnosis present

## 2015-09-15 DIAGNOSIS — B9620 Unspecified Escherichia coli [E. coli] as the cause of diseases classified elsewhere: Secondary | ICD-10-CM | POA: Diagnosis present

## 2015-09-15 DIAGNOSIS — Z885 Allergy status to narcotic agent status: Secondary | ICD-10-CM

## 2015-09-15 DIAGNOSIS — S72009A Fracture of unspecified part of neck of unspecified femur, initial encounter for closed fracture: Secondary | ICD-10-CM | POA: Diagnosis present

## 2015-09-15 LAB — URINALYSIS COMPLETE WITH MICROSCOPIC (ARMC ONLY)
Bilirubin Urine: NEGATIVE
Glucose, UA: NEGATIVE mg/dL
HGB URINE DIPSTICK: NEGATIVE
KETONES UR: NEGATIVE mg/dL
Nitrite: POSITIVE — AB
PH: 7 (ref 5.0–8.0)
PROTEIN: NEGATIVE mg/dL
Specific Gravity, Urine: 1.01 (ref 1.005–1.030)

## 2015-09-15 LAB — CBC WITH DIFFERENTIAL/PLATELET
Basophils Absolute: 0.1 10*3/uL (ref 0–0.1)
Basophils Relative: 1 %
EOS ABS: 0.1 10*3/uL (ref 0–0.7)
Eosinophils Relative: 1 %
HEMATOCRIT: 35.6 % (ref 35.0–47.0)
HEMOGLOBIN: 11.7 g/dL — AB (ref 12.0–16.0)
LYMPHS ABS: 0.6 10*3/uL — AB (ref 1.0–3.6)
LYMPHS PCT: 8 %
MCH: 31.4 pg (ref 26.0–34.0)
MCHC: 32.9 g/dL (ref 32.0–36.0)
MCV: 95.4 fL (ref 80.0–100.0)
Monocytes Absolute: 0.4 10*3/uL (ref 0.2–0.9)
Monocytes Relative: 6 %
NEUTROS ABS: 6.7 10*3/uL — AB (ref 1.4–6.5)
NEUTROS PCT: 84 %
Platelets: 274 10*3/uL (ref 150–440)
RBC: 3.73 MIL/uL — AB (ref 3.80–5.20)
RDW: 15.1 % — ABNORMAL HIGH (ref 11.5–14.5)
WBC: 7.9 10*3/uL (ref 3.6–11.0)

## 2015-09-15 LAB — COMPREHENSIVE METABOLIC PANEL
ALT: 12 U/L — ABNORMAL LOW (ref 14–54)
AST: 15 U/L (ref 15–41)
Albumin: 2.9 g/dL — ABNORMAL LOW (ref 3.5–5.0)
Alkaline Phosphatase: 61 U/L (ref 38–126)
Anion gap: 9 (ref 5–15)
BUN: 17 mg/dL (ref 6–20)
CHLORIDE: 106 mmol/L (ref 101–111)
CO2: 27 mmol/L (ref 22–32)
Calcium: 8.9 mg/dL (ref 8.9–10.3)
Creatinine, Ser: 0.94 mg/dL (ref 0.44–1.00)
GFR, EST NON AFRICAN AMERICAN: 55 mL/min — AB (ref >60)
Glucose, Bld: 107 mg/dL — ABNORMAL HIGH (ref 65–99)
POTASSIUM: 3.9 mmol/L (ref 3.5–5.1)
SODIUM: 142 mmol/L (ref 135–145)
Total Bilirubin: 0.5 mg/dL (ref 0.3–1.2)
Total Protein: 6.8 g/dL (ref 6.5–8.1)

## 2015-09-15 LAB — MRSA PCR SCREENING: MRSA by PCR: NEGATIVE

## 2015-09-15 LAB — PROTIME-INR
INR: 1.08
PROTHROMBIN TIME: 14.2 s (ref 11.4–15.0)

## 2015-09-15 MED ORDER — AMLODIPINE BESYLATE 5 MG PO TABS
2.5000 mg | ORAL_TABLET | Freq: Every day | ORAL | Status: DC
  Administered 2015-09-17 – 2015-09-18 (×2): 2.5 mg via ORAL
  Filled 2015-09-15 (×2): qty 1

## 2015-09-15 MED ORDER — DONEPEZIL HCL 5 MG PO TABS
10.0000 mg | ORAL_TABLET | Freq: Every day | ORAL | Status: DC
  Administered 2015-09-17 – 2015-09-18 (×2): 10 mg via ORAL
  Filled 2015-09-15 (×3): qty 2

## 2015-09-15 MED ORDER — ACETAMINOPHEN 325 MG PO TABS: 650.0000 mg | ORAL_TABLET | Freq: Four times a day (QID) | ORAL | Status: DC | PRN

## 2015-09-15 MED ORDER — ESCITALOPRAM OXALATE 10 MG PO TABS
20.0000 mg | ORAL_TABLET | Freq: Every day | ORAL | Status: DC
  Administered 2015-09-17 – 2015-09-18 (×2): 20 mg via ORAL
  Filled 2015-09-15 (×2): qty 2

## 2015-09-15 MED ORDER — ONDANSETRON HCL 4 MG PO TABS: 4.0000 mg | ORAL_TABLET | Freq: Four times a day (QID) | ORAL | Status: DC | PRN

## 2015-09-15 MED ORDER — LORAZEPAM 0.5 MG PO TABS: 0.5000 mg | ORAL_TABLET | Freq: Three times a day (TID) | ORAL | Status: DC | PRN

## 2015-09-15 MED ORDER — CEFTRIAXONE SODIUM 1 G IJ SOLR
1.0000 g | INTRAMUSCULAR | Status: DC
  Administered 2015-09-15 – 2015-09-16 (×2): 1 g via INTRAVENOUS
  Filled 2015-09-15 (×3): qty 10

## 2015-09-15 MED ORDER — FERROUS SULFATE 325 (65 FE) MG PO TABS
325.0000 mg | ORAL_TABLET | Freq: Every day | ORAL | Status: DC
Start: 2015-09-16 — End: 2015-09-18
  Administered 2015-09-17 – 2015-09-18 (×2): 325 mg via ORAL
  Filled 2015-09-15 (×2): qty 1

## 2015-09-15 MED ORDER — MEMANTINE HCL 5 MG PO TABS
10.0000 mg | ORAL_TABLET | Freq: Two times a day (BID) | ORAL | Status: DC
  Administered 2015-09-15 – 2015-09-18 (×5): 10 mg via ORAL
  Filled 2015-09-15 (×5): qty 2

## 2015-09-15 MED ORDER — ONDANSETRON HCL 4 MG/2ML IJ SOLN: 4.0000 mg | Freq: Four times a day (QID) | INTRAMUSCULAR | Status: DC | PRN

## 2015-09-15 MED ORDER — ALBUTEROL SULFATE (2.5 MG/3ML) 0.083% IN NEBU: 2.5000 mg | INHALATION_SOLUTION | RESPIRATORY_TRACT | Status: DC | PRN

## 2015-09-15 MED ORDER — ACETAMINOPHEN 650 MG RE SUPP: 650.0000 mg | Freq: Four times a day (QID) | RECTAL | Status: DC | PRN

## 2015-09-15 MED ORDER — MORPHINE SULFATE (PF) 4 MG/ML IV SOLN: 4.0000 mg | INTRAVENOUS | Status: DC | PRN

## 2015-09-15 MED ORDER — CYANOCOBALAMIN 500 MCG PO TABS
500.0000 ug | ORAL_TABLET | Freq: Every day | ORAL | Status: DC
  Administered 2015-09-17 – 2015-09-18 (×2): 500 ug via ORAL
  Filled 2015-09-15 (×2): qty 1

## 2015-09-15 MED ORDER — CEFAZOLIN SODIUM-DEXTROSE 2-4 GM/100ML-% IV SOLN
2.0000 g | INTRAVENOUS | Status: DC
  Filled 2015-09-15 (×2): qty 100

## 2015-09-15 NOTE — Consult Note (Signed)
ORTHOPAEDIC CONSULTATION  REQUESTING PHYSICIAN: Shaune Pollack, MD  Chief Complaint:   Right hip pain.  History of Present Illness: Jillian Horn is a 82 y.o. female with a history of osteoporosis, rheumatoid arthritis, hypercholesterolemia, and dementia who lives in an assisted living facility due to her dementia. Apparently, she fell this afternoon onto her right hip. She was brought to the emergency room where x-rays demonstrated an impacted right femoral neck fracture with posterior angulation. She was admitted for further evaluation and treatment. The patient does not recall exactly why she fell, but denies any associated injuries. She apparently did not strike her head or lose consciousness.  Past Medical History  Diagnosis Date  . Rheumatoid arthritis(714.0)     seropositive, oligo-articular.  MTX, s/p plaquenil, cytopenia on sulfasalazine, s/p remicade  . Osteoporosis     s/p fosamax, s/p forteo, reclast  . Diverticulosis   . Anemia     erosion on EGD  . Hiatal hernia   . Hypercholesterolemia   . Dementia    Past Surgical History  Procedure Laterality Date  . Skin cancer excision    . Kyphoplasty      thoracic spine  . Breast biopsy    . Abdominal hysterectomy      partial  . Appendectomy     Social History   Social History  . Marital Status: Married    Spouse Name: N/A  . Number of Children: N/A  . Years of Education: N/A   Social History Main Topics  . Smoking status: Never Smoker   . Smokeless tobacco: Never Used  . Alcohol Use: No  . Drug Use: No  . Sexual Activity: Not Asked   Other Topics Concern  . None   Social History Narrative   Family History  Problem Relation Age of Onset  . Heart attack Mother   . Stroke Father   . Cancer Sister    Allergies  Allergen Reactions  . Codeine Sulfate Other (See Comments)    Reaction:  Unknown   . Lidocaine Other (See Comments)    Reaction:   Unknown   . Sulfa Antibiotics Other (See Comments)    Reaction:  Unknown   . Sulfasalazine Other (See Comments)    Reaction:  Unknown    Prior to Admission medications   Medication Sig Start Date End Date Taking? Authorizing Provider  acetaminophen (TYLENOL) 500 MG tablet Take 500 mg by mouth 2 (two) times daily.   Yes Historical Provider, MD  amLODipine (NORVASC) 2.5 MG tablet Take 2.5 mg by mouth daily.   Yes Historical Provider, MD  cyanocobalamin 500 MCG tablet Take 500 mcg by mouth daily.   Yes Historical Provider, MD  donepezil (ARICEPT) 10 MG tablet Take 10 mg by mouth daily.   Yes Historical Provider, MD  escitalopram (LEXAPRO) 20 MG tablet Take 20 mg by mouth daily.   Yes Historical Provider, MD  ferrous sulfate 325 (65 FE) MG tablet Take 325 mg by mouth daily with breakfast.   Yes Historical Provider, MD  LORazepam (ATIVAN) 0.5 MG tablet Take 0.5 mg by mouth every 8 (eight) hours as needed (for agitation).   Yes Historical Provider, MD  memantine (NAMENDA) 10 MG tablet Take 10 mg by mouth 2 (two) times daily.   Yes Historical Provider, MD   Dg Chest 1 View  09/15/2015  CLINICAL DATA:  Right hip fracture. EXAM: CHEST 1 VIEW COMPARISON:  Two-view chest x-ray 05/04/2009 FINDINGS: A prominent hiatal hernia is again seen. The heart  is enlarged. Lung volumes are low. No focal airspace disease is present. There is no edema or effusion to suggest failure. IMPRESSION: Bone 1. Stable cardiomegaly without failure. 2. Low lung volumes. 3. Large hiatal hernia. Electronically Signed   By: Marin Roberts M.D.   On: 09/15/2015 15:33   Ct Head Wo Contrast  09/15/2015  CLINICAL DATA:  Recent fall EXAM: CT HEAD WITHOUT CONTRAST TECHNIQUE: Contiguous axial images were obtained from the base of the skull through the vertex without intravenous contrast. COMPARISON:  April 18, 2015 FINDINGS: Brain: Mild diffuse atrophy is stable. There is no intracranial mass, hemorrhage, extra-axial fluid  collection, or midline shift. There is patchy small vessel disease throughout the centra semiovale bilaterally, stable. Elsewhere gray-white compartments appear unremarkable. No acute infarct evident. Vascular: There is calcification in both cavernous carotid artery regions. Skull: The bony calvarium appears intact. Sinuses/Orbits: Orbits appear symmetric bilaterally. Paranasal sinuses which are visualized are clear. Other: Mastoid air cells are clear. IMPRESSION: Atrophy with extensive supratentorial small vessel disease, stable. No acute infarct evident. No intracranial mass, hemorrhage, or extra-axial fluid collection. There is cavernous carotid artery calcification bilaterally. Electronically Signed   By: Bretta Bang III M.D.   On: 09/15/2015 15:22   Dg Hip Unilat  With Pelvis 2-3 Views Right  09/15/2015  CLINICAL DATA:  Pain in right hip after fall. EXAM: DG HIP (WITH OR WITHOUT PELVIS) 2-3V RIGHT COMPARISON:  None. FINDINGS: There is foreshortening of the right femoral neck with a step-off both laterally and medially concerning for subcapital fracture with impaction. No other acute abnormalities. IMPRESSION: The findings are highly concerning for a subcapital fracture with impaction. Electronically Signed   By: Gerome Sam III M.D   On: 09/15/2015 15:26    Positive ROS: All other systems have been reviewed and were otherwise negative with the exception of those mentioned in the HPI and as above.  Physical Exam: General:  Alert, no acute distress Psychiatric:  Patient is not competent for consent, but exhibits normal mood and affect   Cardiovascular:  No pedal edema Respiratory:  No wheezing, non-labored breathing GI:  Abdomen is soft and non-tender Skin:  No lesions in the area of chief complaint Neurologic:  Sensation intact distally Lymphatic:  No axillary or cervical lymphadenopathy  Orthopedic Exam:  Orthopedic examination is limited to the right hip and lower extremity. Skin  inspection around the right hip is unremarkable. She has mild tenderness to palpation over the anterolateral aspect of the hip. The patient has reproduction of her right hip pain with any attempted active or passive range of motion of the hip. Her right leg is slightly shortened and externally rotated as compared to the left. She is neurovascularly intact to the right lower extremity and foot.  X-rays:  X-rays of the pelvis and right hip are available for review. These films demonstrate an impacted right femoral neck fracture with posterior angulation as seen on the cross table lateral view. The joint itself appears to be in satisfactory condition. No lytic lesions or other bony abnormalities are identified.  Assessment: Displaced right femoral neck fracture.  Plan: The treatment options were discussed with the patient and the patient's son who is at the patient's bedside. After considering both cannulated screw fixation versus a right hip hemiarthroplasty, we have decided to proceed with a right hip hemiarthroplasty. This procedure has been discussed in detail with the patient and her family as have the potential risks (including bleeding, infection, nerve and/or blood vessel injury, persistent  or recurrent pain, stiffness, loosening of and/or failure of the components, dislocation, leg length inequality, need for further surgery, blood clots, strokes, heart attacks and/or arrhythmias, etc.) and benefits. The patient's family states her understanding and wished to proceed. A formal written consent will be obtained by the nursing staff.  Thank you for asking me to participate in the care of this most pleasant woman. I will be happy to follow her with you.   Maryagnes Amos, MD  Beeper #:  [phone]  09/15/2015 5:25 PM

## 2015-09-15 NOTE — ED Notes (Signed)
Pt to ED via EMS from [facility] for mechanical fall. Pt c/o little RT hip pain, RT leg shows obvious shortening. Per EMS pt did ambulate to stretcher. Pt A&O , denies any other injuries

## 2015-09-15 NOTE — ED Provider Notes (Addendum)
[facility] Emergency Department Provider Note  ____________________________________________   I have reviewed the triage vital signs and the nursing notes.   HISTORY  Chief Complaint Fall    HPI Jillian Horn is a 82 y.o. female  presents today complaining of pain to the right hip. She did fall which is reported to be nonsurgical. States she is not sure why she fell. She does not have any headache or any focal neurologic concerns. She denies syncope or passing out. She has no other complaints. History is somewhat limited patient is a history of dementia. At her baseline per EMS. No evidence of anticoagulation on med list that we have.     Past Medical History  Diagnosis Date  . Rheumatoid arthritis(714.0)     seropositive, oligo-articular.  MTX, s/p plaquenil, cytopenia on sulfasalazine, s/p remicade  . Osteoporosis     s/p fosamax, s/p forteo, reclast  . Diverticulosis   . Anemia     erosion on EGD  . Hiatal hernia   . Hypercholesterolemia   . Dementia     Patient Active Problem List   Diagnosis Date Noted  . Noncompliance with medications 11/08/2014  . Health care maintenance 05/08/2014  . Rheumatoid arthritis (HCC) 08/10/2012  . Anemia 08/10/2012  . Osteoporosis 08/10/2012  . Hypercholesterolemia 08/10/2012  . Dementia 08/10/2012  . Hiatal hernia 08/10/2012    Past Surgical History  Procedure Laterality Date  . Skin cancer excision    . Kyphoplasty      thoracic spine  . Breast biopsy    . Abdominal hysterectomy      partial  . Appendectomy      Current Outpatient Rx  Name  Route  Sig  Dispense  Refill  . acetaminophen (TYLENOL) 500 MG tablet   Oral   Take 500 mg by mouth 2 (two) times daily.         Marland Kitchen amLODipine (NORVASC) 2.5 MG tablet   Oral   Take 2.5 mg by mouth daily.         . Calcium Carb-Cholecalciferol (CALCIUM 600/VITAMIN D3) 600-800 MG-UNIT TABS   Oral   Take 1 tablet by mouth 2 (two) times daily.          . cyanocobalamin 500 MCG tablet   Oral   Take 500 mcg by mouth daily.         Marland Kitchen donepezil (ARICEPT) 10 MG tablet   Oral   Take 10 mg by mouth daily.         Marland Kitchen escitalopram (LEXAPRO) 20 MG tablet   Oral   Take 20 mg by mouth daily.         . folic acid (FOLVITE) 1 MG tablet   Oral   Take 1 mg by mouth daily.         Marland Kitchen LORazepam (ATIVAN) 0.5 MG tablet   Oral   Take 1 tablet (0.5 mg total) by mouth every 8 (eight) hours as needed (agitation).   30 tablet   0   . memantine (NAMENDA) 10 MG tablet   Oral   Take 10 mg by mouth 2 (two) times daily.         . methotrexate (RHEUMATREX) 2.5 MG tablet   Oral   Take 10 mg by mouth once a week. (take on Wednesday) Caution:Chemotherapy. Protect from light.         Marland Kitchen omeprazole (PRILOSEC) 20 MG capsule   Oral   Take 20 mg by mouth daily.         Marland Kitchen  pravastatin (PRAVACHOL) 40 MG tablet   Oral   Take 40 mg by mouth daily.           Allergies Codeine sulfate; Lidocaine; Sulfa antibiotics; and Sulfasalazine  No family history on file.  Social History Social History  Substance Use Topics  . Smoking status: Never Smoker   . Smokeless tobacco: Never Used  . Alcohol Use: No    Review of Systems, Limited by patient dementia but these are her answers. Constitutional: No fever/chills Eyes: No visual changes. ENT: No sore throat. No stiff neck no neck pain Cardiovascular: Denies chest pain. Respiratory: Denies shortness of breath. Gastrointestinal:   no vomiting.  No diarrhea.  No constipation. Genitourinary: Negative for dysuria. Musculoskeletal: Negative lower extremity swelling Skin: Negative for rash. Neurological: Negative for headaches, focal weakness or numbness. 10-point ROS otherwise negative.  ____________________________________________   PHYSICAL EXAM:  VITAL SIGNS: ED Triage Vitals  Enc Vitals Group     BP 09/15/15 1422 131/73 mmHg     Pulse Rate 09/15/15 1422 70     Resp 09/15/15 1422  14     Temp 09/15/15 1422 97.8 F (36.6 C)     Temp Source 09/15/15 1422 Oral     SpO2 09/15/15 1422 97 %     Weight --      Height --      Head Cir --      Peak Flow --      Pain Score 09/15/15 1425 0     Pain Loc --      Pain Edu? --      Excl. in GC? --     Constitutional: Alert and orientedTo name and place unsure of the date. Well appearing and in no acute distress. Eyes: Conjunctivae are normal. PERRL. EOMI. Head: Atraumatic. Nose: No congestion/rhinnorhea. Mouth/Throat: Mucous membranes are moist.  Oropharynx non-erythematous. Neck: No stridor.   Nontender with no meningismus Cardiovascular: Normal rate, regular rhythm. Grossly normal heart sounds.  Good peripheral circulation. Respiratory: Normal respiratory effort.  No retractions. Lungs CTAB. Abdominal: Soft and nontender. No distention. No guarding no rebound Back:  There is no focal tenderness or step off there is no midline tenderness there are no lesions noted. there is no CVA tenderness Musculoskeletal: Tenderness to palpation in the right hip No joint effusions, no DVT signs strong distal pulses no edema Neurologic:  Normal speech and language. No gross focal neurologic deficits are appreciated.  Skin:  Skin is warm, dry and intact. No rash noted. Psychiatric: Mood and affect are normal. Speech and behavior are normal.  ____________________________________________   LABS (all labs ordered are listed, but only abnormal results are displayed)  Labs Reviewed  COMPREHENSIVE METABOLIC PANEL  CBC WITH DIFFERENTIAL/PLATELET  URINALYSIS COMPLETEWITH MICROSCOPIC ([facility] ONLY)  PROTIME-INR   ____________________________________________  EKG  I personally interpreted any EKGs ordered by me or triage Sinus rhythm rate 60 beats per an acute ST elevation or acute ST depression ____________________________________________  RADIOLOGY  I reviewed any imaging ordered by me or triage that were performed during my shift  and, if possible, patient and/or family made aware of any abnormal findings. ____________________________________________   PROCEDURES  Procedure(s) performed: None  Critical Care performed: None  ____________________________________________   INITIAL IMPRESSION / ASSESSMENT AND PLAN / ED COURSE  Pertinent labs & imaging results that were available during my care of the patient were reviewed by me and considered in my medical decision making (see chart for details).  Patient with what was reported  to be a nonsurgical fall with right hip pain. Given her age and inability to clearly recollect the fall we'll obtain a CT scan although at this time no evidence of acute head injury. We will obtain x-rays of her hip which I suspect may be broken we will start preop workup.  ----------------------------------------- 4:29 PM on 09/15/2015 -----------------------------------------  Noted to the hospitalist service, discussed with Dr. Joice Lofts ____________________________________________   FINAL CLINICAL IMPRESSION(S) / ED DIAGNOSES  Final diagnoses:  None      This chart was dictated using voice recognition software.  Despite best efforts to proofread,  errors can occur which can change meaning.     Jeanmarie Plant, MD 09/15/15 1440  Jeanmarie Plant, MD 09/15/15 1629  Jeanmarie Plant, MD 10/09/15 [phone]

## 2015-09-15 NOTE — H&P (Addendum)
[facility] [place] at [facility]   PATIENT NAME: Jillian Horn    MR#:  [id number]  DATE OF BIRTH:  04-30-1929  DATE OF ADMISSION:  09/15/2015  PRIMARY CARE PHYSICIAN: Dale [place], MD   REQUESTING/REFERRING PHYSICIAN: Jeanmarie Plant, MD  CHIEF COMPLAINT:   Chief Complaint  Patient presents with  . Fall   Fall today HISTORY OF PRESENT ILLNESS:  Jillian Horn  is a 82 y.o. female with a known history of Dementia, rheumatoid arthritis and anemia. The patient fell by accident in skilled nursing facility. She complained of right hip pain but denies any headache, dizziness, syncope or loss of consciousness. She denies any fever or chills, no dysuria or hematuria. She was found to have right hip fracture. ED physician called hospitalist for admission and orthopedic surgeon for evaluation of possible surgery. The patient denies any other symptoms.  PAST MEDICAL HISTORY:   Past Medical History  Diagnosis Date  . Rheumatoid arthritis(714.0)     seropositive, oligo-articular.  MTX, s/p plaquenil, cytopenia on sulfasalazine, s/p remicade  . Osteoporosis     s/p fosamax, s/p forteo, reclast  . Diverticulosis   . Anemia     erosion on EGD  . Hiatal hernia   . Hypercholesterolemia   . Dementia     PAST SURGICAL HISTORY:   Past Surgical History  Procedure Laterality Date  . Skin cancer excision    . Kyphoplasty      thoracic spine  . Breast biopsy    . Abdominal hysterectomy      partial  . Appendectomy      SOCIAL HISTORY:   Social History  Substance Use Topics  . Smoking status: Never Smoker   . Smokeless tobacco: Never Used  . Alcohol Use: No    FAMILY HISTORY:  No family history on file.  DRUG ALLERGIES:   Allergies  Allergen Reactions  . Codeine Sulfate Other (See Comments)    Reaction:  Unknown   . Lidocaine Other (See Comments)    Reaction:  Unknown   . Sulfa Antibiotics Other (See Comments)    Reaction:  Unknown   .  Sulfasalazine Other (See Comments)    Reaction:  Unknown     REVIEW OF SYSTEMS:  CONSTITUTIONAL: No fever, fatigue or weakness.  EYES: No blurred or double vision.  EARS, NOSE, AND THROAT: No tinnitus or ear pain.  RESPIRATORY: No cough, shortness of breath, wheezing or hemoptysis.  CARDIOVASCULAR: No chest pain, orthopnea, edema.  GASTROINTESTINAL: No nausea, vomiting, diarrhea or abdominal pain.  GENITOURINARY: No dysuria, hematuria.  ENDOCRINE: No polyuria, nocturia,  HEMATOLOGY: No anemia, easy bruising or bleeding SKIN: No rash or lesion. MUSCULOSKELETAL: Has right hip pain. NEUROLOGIC: No tingling, numbness, weakness.  PSYCHIATRY: No anxiety or depression.   MEDICATIONS AT HOME:   Prior to Admission medications   Medication Sig Start Date End Date Taking? Authorizing Provider  acetaminophen (TYLENOL) 500 MG tablet Take 500 mg by mouth 2 (two) times daily.   Yes Historical Provider, MD  amLODipine (NORVASC) 2.5 MG tablet Take 2.5 mg by mouth daily.   Yes Historical Provider, MD  cyanocobalamin 500 MCG tablet Take 500 mcg by mouth daily.   Yes Historical Provider, MD  donepezil (ARICEPT) 10 MG tablet Take 10 mg by mouth daily.   Yes Historical Provider, MD  escitalopram (LEXAPRO) 20 MG tablet Take 20 mg by mouth daily.   Yes Historical Provider, MD  ferrous sulfate 325 (65 FE) MG tablet Take 325  mg by mouth daily with breakfast.   Yes Historical Provider, MD  LORazepam (ATIVAN) 0.5 MG tablet Take 0.5 mg by mouth every 8 (eight) hours as needed (for agitation).   Yes Historical Provider, MD  memantine (NAMENDA) 10 MG tablet Take 10 mg by mouth 2 (two) times daily.   Yes Historical Provider, MD      VITAL SIGNS:  Blood pressure 146/78, pulse 69, temperature 97.8 F (36.6 C), temperature source Oral, resp. rate 17, SpO2 97 %.  PHYSICAL EXAMINATION:  GENERAL:  82 y.o.-year-old patient lying in the bed with no acute distress.  EYES: Pupils equal, round, reactive to light and  accommodation. No scleral icterus. Extraocular muscles intact.  HEENT: Head atraumatic, normocephalic. Oropharynx and nasopharynx clear.  NECK:  Supple, no jugular venous distention. No thyroid enlargement, no tenderness.  LUNGS: Normal breath sounds bilaterally, no wheezing, rales,rhonchi or crepitation. No use of accessory muscles of respiration.  CARDIOVASCULAR: S1, S2 normal. No murmurs, rubs, or gallops.  ABDOMEN: Soft, nontender, nondistended. Bowel sounds present. No organomegaly or mass.  EXTREMITIES: No pedal edema, cyanosis, or clubbing. Unable to move lower extremities. NEUROLOGIC: Cranial nerves II through XII are intact. Sensation intact. Gait not checked.  PSYCHIATRIC: The patient is alert and oriented x 2.  SKIN: No obvious rash, lesion, or ulcer.   LABORATORY PANEL:   CBC  Recent Labs Lab 09/15/15 1530  WBC 7.9  HGB 11.7*  HCT 35.6  PLT 274   ------------------------------------------------------------------------------------------------------------------  Chemistries   Recent Labs Lab 09/15/15 1530  NA 142  K 3.9  CL 106  CO2 27  GLUCOSE 107*  BUN 17  CREATININE 0.94  CALCIUM 8.9  AST 15  ALT 12*  ALKPHOS 61  BILITOT 0.5   ------------------------------------------------------------------------------------------------------------------  Cardiac Enzymes No results for input(s): TROPONINI in the last 168 hours. ------------------------------------------------------------------------------------------------------------------  RADIOLOGY:  Dg Chest 1 View  09/15/2015  CLINICAL DATA:  Right hip fracture. EXAM: CHEST 1 VIEW COMPARISON:  Two-view chest x-ray 05/04/2009 FINDINGS: A prominent hiatal hernia is again seen. The heart is enlarged. Lung volumes are low. No focal airspace disease is present. There is no edema or effusion to suggest failure. IMPRESSION: Bone 1. Stable cardiomegaly without failure. 2. Low lung volumes. 3. Large hiatal hernia.  Electronically Signed   By: Marin Roberts M.D.   On: 09/15/2015 15:33   Ct Head Wo Contrast  09/15/2015  CLINICAL DATA:  Recent fall EXAM: CT HEAD WITHOUT CONTRAST TECHNIQUE: Contiguous axial images were obtained from the base of the skull through the vertex without intravenous contrast. COMPARISON:  April 18, 2015 FINDINGS: Brain: Mild diffuse atrophy is stable. There is no intracranial mass, hemorrhage, extra-axial fluid collection, or midline shift. There is patchy small vessel disease throughout the centra semiovale bilaterally, stable. Elsewhere gray-white compartments appear unremarkable. No acute infarct evident. Vascular: There is calcification in both cavernous carotid artery regions. Skull: The bony calvarium appears intact. Sinuses/Orbits: Orbits appear symmetric bilaterally. Paranasal sinuses which are visualized are clear. Other: Mastoid air cells are clear. IMPRESSION: Atrophy with extensive supratentorial small vessel disease, stable. No acute infarct evident. No intracranial mass, hemorrhage, or extra-axial fluid collection. There is cavernous carotid artery calcification bilaterally. Electronically Signed   By: Bretta Bang III M.D.   On: 09/15/2015 15:22   Dg Hip Unilat  With Pelvis 2-3 Views Right  09/15/2015  CLINICAL DATA:  Pain in right hip after fall. EXAM: DG HIP (WITH OR WITHOUT PELVIS) 2-3V RIGHT COMPARISON:  None. FINDINGS: There is foreshortening  of the right femoral neck with a step-off both laterally and medially concerning for subcapital fracture with impaction. No other acute abnormalities. IMPRESSION: The findings are highly concerning for a subcapital fracture with impaction. Electronically Signed   By: Gerome Sam III M.D   On: 09/15/2015 15:26    EKG:   Orders placed or performed during the hospital encounter of 09/15/15  . ED EKG  . ED EKG  . EKG 12-Lead  . EKG 12-Lead    IMPRESSION AND PLAN:   Right hip fracture The patient will be admitted  to medical floor. The patient has moderate risk for hip fracture surgery. Orthopedic consult for possible surgery. Keep nothing by mouth with IV fluid support, pain control, hold DVT prophylaxis for surgery. DVT prophylaxis and PT after surgery.  UTI. Urinalysis show UTI. I will start Rocephin in the follow-up urine culture.  Dementia. Continue dementia medication.  All the records are reviewed and case discussed with ED provider. Management plans discussed with the patient, her son and they are in agreement.  CODE STATUS: Full code  TOTAL TIME TAKING CARE OF THIS PATIENT: 53 minutes.    Shaune Pollack M.D on 09/15/2015 at 4:49 PM  Between 7am to 6pm - Pager - [phone]  After 6pm go to [email or web address] - password [email or web address] [facility]  [place] [facility]  Office  [phone]  CC: Primary care physician; Dale [place]ene, MD

## 2015-09-16 ENCOUNTER — Inpatient Hospital Stay: Payer: Medicare Other | Admitting: Anesthesiology

## 2015-09-16 ENCOUNTER — Encounter
Admission: EM | Disposition: A | Discharge status: Skilled Nursing Facility | Payer: Self-pay | Source: Home / Self Care | Attending: Internal Medicine

## 2015-09-16 ENCOUNTER — Inpatient Hospital Stay: Payer: Medicare Other

## 2015-09-16 HISTORY — PX: HIP ARTHROPLASTY: SHX981

## 2015-09-16 LAB — CBC
HEMATOCRIT: 30.1 % — AB (ref 35.0–47.0)
HEMOGLOBIN: 10.3 g/dL — AB (ref 12.0–16.0)
MCH: 31.7 pg (ref 26.0–34.0)
MCHC: 34.1 g/dL (ref 32.0–36.0)
MCV: 92.9 fL (ref 80.0–100.0)
PLATELETS: 239 10*3/uL (ref 150–440)
RBC: 3.24 MIL/uL — AB (ref 3.80–5.20)
RDW: 14.6 % — ABNORMAL HIGH (ref 11.5–14.5)
WBC: 8.7 10*3/uL (ref 3.6–11.0)

## 2015-09-16 LAB — BASIC METABOLIC PANEL
ANION GAP: 6 (ref 5–15)
BUN: 13 mg/dL (ref 6–20)
CHLORIDE: 108 mmol/L (ref 101–111)
CO2: 27 mmol/L (ref 22–32)
Calcium: 8.5 mg/dL — ABNORMAL LOW (ref 8.9–10.3)
Creatinine, Ser: 0.85 mg/dL (ref 0.44–1.00)
GFR calc non Af Amer: >60 mL/min (ref >60)
Glucose, Bld: 101 mg/dL — ABNORMAL HIGH (ref 65–99)
POTASSIUM: 3.8 mmol/L (ref 3.5–5.1)
Sodium: 141 mmol/L (ref 135–145)

## 2015-09-16 SURGERY — HEMIARTHROPLASTY, HIP, DIRECT ANTERIOR APPROACH, FOR FRACTURE
Anesthesia: Spinal | Laterality: Right

## 2015-09-16 MED ORDER — CEFAZOLIN SODIUM-DEXTROSE 2-3 GM-% IV SOLR
INTRAVENOUS | Status: DC | PRN
  Administered 2015-09-16: 2 g via INTRAVENOUS

## 2015-09-16 MED ORDER — PROPOFOL 10 MG/ML IV BOLUS
INTRAVENOUS | Status: DC | PRN
  Administered 2015-09-16 (×2): 10 mg via INTRAVENOUS

## 2015-09-16 MED ORDER — PHENYLEPHRINE HCL 10 MG/ML IJ SOLN
INTRAMUSCULAR | Status: DC | PRN
  Administered 2015-09-16: 100 ug via INTRAVENOUS

## 2015-09-16 MED ORDER — CEFAZOLIN SODIUM-DEXTROSE 2-4 GM/100ML-% IV SOLN
2.0000 g | Freq: Four times a day (QID) | INTRAVENOUS | Status: AC
  Administered 2015-09-16 – 2015-09-17 (×3): 2 g via INTRAVENOUS
  Filled 2015-09-16 (×3): qty 100

## 2015-09-16 MED ORDER — BISACODYL 10 MG RE SUPP: 10.0000 mg | Freq: Every day | RECTAL | Status: DC | PRN

## 2015-09-16 MED ORDER — KCL IN DEXTROSE-NACL 20-5-0.9 MEQ/L-%-% IV SOLN
INTRAVENOUS | Status: DC
  Administered 2015-09-16 – 2015-09-17 (×2): via INTRAVENOUS
  Filled 2015-09-16 (×3): qty 1000

## 2015-09-16 MED ORDER — MAGNESIUM HYDROXIDE 400 MG/5ML PO SUSP: 30.0000 mL | Freq: Every day | ORAL | Status: DC | PRN

## 2015-09-16 MED ORDER — TRANEXAMIC ACID 1000 MG/10ML IV SOLN
INTRAVENOUS | Status: DC | PRN
  Administered 2015-09-16: 1000 mg

## 2015-09-16 MED ORDER — ENOXAPARIN SODIUM 40 MG/0.4ML ~~LOC~~ SOLN
40.0000 mg | SUBCUTANEOUS | Status: DC
  Administered 2015-09-17 – 2015-09-18 (×2): 40 mg via SUBCUTANEOUS
  Filled 2015-09-16 (×2): qty 0.4

## 2015-09-16 MED ORDER — ACETAMINOPHEN 500 MG PO TABS
1000.0000 mg | ORAL_TABLET | Freq: Four times a day (QID) | ORAL | Status: AC
  Administered 2015-09-16 – 2015-09-17 (×3): 1000 mg via ORAL
  Filled 2015-09-16 (×3): qty 2

## 2015-09-16 MED ORDER — ACETAMINOPHEN 650 MG RE SUPP: 650.0000 mg | Freq: Four times a day (QID) | RECTAL | Status: DC | PRN

## 2015-09-16 MED ORDER — METOCLOPRAMIDE HCL 10 MG PO TABS: 5.0000 mg | ORAL_TABLET | Freq: Three times a day (TID) | ORAL | Status: DC | PRN

## 2015-09-16 MED ORDER — OXYCODONE HCL 5 MG PO TABS: 5.0000 mg | ORAL_TABLET | ORAL | Status: DC | PRN

## 2015-09-16 MED ORDER — BUPIVACAINE HCL (PF) 0.5 % IJ SOLN
INTRAMUSCULAR | Status: DC | PRN
  Administered 2015-09-16: 3 mL

## 2015-09-16 MED ORDER — DIPHENHYDRAMINE HCL 12.5 MG/5ML PO ELIX: 12.5000 mg | ORAL_SOLUTION | ORAL | Status: DC | PRN

## 2015-09-16 MED ORDER — HYDROMORPHONE HCL 1 MG/ML IJ SOLN: 0.5000 mg | INTRAMUSCULAR | Status: DC | PRN

## 2015-09-16 MED ORDER — BUPIVACAINE LIPOSOME 1.3 % IJ SUSP
INTRAMUSCULAR | Status: AC
  Filled 2015-09-16: qty 20

## 2015-09-16 MED ORDER — BUPIVACAINE-EPINEPHRINE (PF) 0.25% -1:200000 IJ SOLN
INTRAMUSCULAR | Status: AC
  Filled 2015-09-16: qty 30

## 2015-09-16 MED ORDER — DOCUSATE SODIUM 100 MG PO CAPS
100.0000 mg | ORAL_CAPSULE | Freq: Two times a day (BID) | ORAL | Status: DC
  Administered 2015-09-16 – 2015-09-18 (×5): 100 mg via ORAL
  Filled 2015-09-16 (×5): qty 1

## 2015-09-16 MED ORDER — SODIUM CHLORIDE 0.9 % IJ SOLN
INTRAMUSCULAR | Status: AC
  Filled 2015-09-16: qty 50

## 2015-09-16 MED ORDER — NEOMYCIN-POLYMYXIN B GU 40-200000 IR SOLN
Status: AC
  Filled 2015-09-16: qty 20

## 2015-09-16 MED ORDER — ONDANSETRON HCL 4 MG PO TABS: 4.0000 mg | ORAL_TABLET | Freq: Four times a day (QID) | ORAL | Status: DC | PRN

## 2015-09-16 MED ORDER — ACETAMINOPHEN 325 MG PO TABS
650.0000 mg | ORAL_TABLET | Freq: Four times a day (QID) | ORAL | Status: DC | PRN
  Administered 2015-09-18: 650 mg via ORAL
  Filled 2015-09-16 (×2): qty 2

## 2015-09-16 MED ORDER — ONDANSETRON HCL 4 MG/2ML IJ SOLN: 4.0000 mg | Freq: Four times a day (QID) | INTRAMUSCULAR | Status: DC | PRN

## 2015-09-16 MED ORDER — ACETAMINOPHEN 10 MG/ML IV SOLN
INTRAVENOUS | Status: AC
  Filled 2015-09-16: qty 100

## 2015-09-16 MED ORDER — BUPIVACAINE LIPOSOME 1.3 % IJ SUSP
INTRAMUSCULAR | Status: DC | PRN
  Administered 2015-09-16: 20 mL

## 2015-09-16 MED ORDER — FLEET ENEMA 7-19 GM/118ML RE ENEM: 1.0000 | ENEMA | Freq: Once | RECTAL | Status: DC | PRN

## 2015-09-16 MED ORDER — PANTOPRAZOLE SODIUM 40 MG PO TBEC
40.0000 mg | DELAYED_RELEASE_TABLET | Freq: Every day | ORAL | Status: DC
  Administered 2015-09-16 – 2015-09-18 (×3): 40 mg via ORAL
  Filled 2015-09-16 (×3): qty 1

## 2015-09-16 MED ORDER — BUPIVACAINE-EPINEPHRINE (PF) 0.25% -1:200000 IJ SOLN
INTRAMUSCULAR | Status: DC | PRN
  Administered 2015-09-16: 30 mL via PERINEURAL

## 2015-09-16 MED ORDER — PHENYLEPHRINE HCL 10 MG/ML IJ SOLN
INTRAMUSCULAR | Status: AC
  Filled 2015-09-16: qty 1

## 2015-09-16 MED ORDER — FENTANYL CITRATE (PF) 100 MCG/2ML IJ SOLN: 25.0000 ug | INTRAMUSCULAR | Status: DC | PRN

## 2015-09-16 MED ORDER — ACETAMINOPHEN 10 MG/ML IV SOLN
INTRAVENOUS | Status: DC | PRN
  Administered 2015-09-16: 1000 mg via INTRAVENOUS

## 2015-09-16 MED ORDER — ONDANSETRON HCL 4 MG/2ML IJ SOLN: 4.0000 mg | Freq: Once | INTRAMUSCULAR | Status: DC | PRN

## 2015-09-16 MED ORDER — EPHEDRINE SULFATE 50 MG/ML IJ SOLN
INTRAMUSCULAR | Status: DC | PRN
  Administered 2015-09-16 (×2): 10 mg via INTRAVENOUS

## 2015-09-16 MED ORDER — PROPOFOL 500 MG/50ML IV EMUL
INTRAVENOUS | Status: DC | PRN
  Administered 2015-09-16: 25 ug/kg/min via INTRAVENOUS

## 2015-09-16 MED ORDER — TRANEXAMIC ACID 1000 MG/10ML IV SOLN
INTRAVENOUS | Status: AC
  Filled 2015-09-16: qty 10

## 2015-09-16 MED ORDER — METOCLOPRAMIDE HCL 5 MG/ML IJ SOLN: 5.0000 mg | Freq: Three times a day (TID) | INTRAMUSCULAR | Status: DC | PRN

## 2015-09-16 MED ORDER — KETAMINE HCL 10 MG/ML IJ SOLN
INTRAMUSCULAR | Status: DC | PRN
  Administered 2015-09-16: 10 mg via INTRAVENOUS
  Administered 2015-09-16 (×2): 20 mg via INTRAVENOUS

## 2015-09-16 MED ORDER — NEOMYCIN-POLYMYXIN B GU 40-200000 IR SOLN
Status: DC | PRN
Start: 2015-09-16 — End: 2015-09-16
  Administered 2015-09-16: 16 mL

## 2015-09-16 MED ORDER — LACTATED RINGERS IV SOLN
INTRAVENOUS | Status: DC | PRN
  Administered 2015-09-16: 08:00:00 via INTRAVENOUS

## 2015-09-16 SURGICAL SUPPLY — 54 items
BAG DECANTER FOR FLEXI CONT (MISCELLANEOUS) ×2 IMPLANT
BLADE SAGITTAL WIDE XTHICK NO (BLADE) ×2 IMPLANT
BLADE SURG SZ20 CARB STEEL (BLADE) ×2 IMPLANT
BNDG COHESIVE 6X5 TAN STRL LF (GAUZE/BANDAGES/DRESSINGS) ×2 IMPLANT
BOWL CEMENT MIXING ADV NOZZLE (MISCELLANEOUS) ×2 IMPLANT
CANISTER SUCT 1200ML W/VALVE (MISCELLANEOUS) ×2 IMPLANT
CANISTER SUCT 3000ML (MISCELLANEOUS) ×4 IMPLANT
CAPT HIP HEMI 2 ×2 IMPLANT
CHLORAPREP W/TINT 26ML (MISCELLANEOUS) ×2 IMPLANT
DECANTER SPIKE VIAL GLASS SM (MISCELLANEOUS) ×4 IMPLANT
DRAPE IMP U-DRAPE 54X76 (DRAPES) ×4 IMPLANT
DRAPE INCISE IOBAN 66X60 STRL (DRAPES) ×2 IMPLANT
DRAPE SHEET LG 3/4 BI-LAMINATE (DRAPES) ×2 IMPLANT
DRAPE SURG 17X23 STRL (DRAPES) ×2 IMPLANT
DRSG OPSITE POSTOP 4X12 (GAUZE/BANDAGES/DRESSINGS) IMPLANT
DRSG OPSITE POSTOP 4X14 (GAUZE/BANDAGES/DRESSINGS) IMPLANT
DRSG OPSITE POSTOP 4X8 (GAUZE/BANDAGES/DRESSINGS) ×4 IMPLANT
ELECT BLADE 6.5 EXT (BLADE) ×2 IMPLANT
ELECT CAUTERY BLADE 6.4 (BLADE) ×2 IMPLANT
ELECT REM PT RETURN 9FT ADLT (ELECTROSURGICAL) ×2
ELECTRODE REM PT RTRN 9FT ADLT (ELECTROSURGICAL) ×1 IMPLANT
GAUZE PACK 2X3YD (MISCELLANEOUS) ×2 IMPLANT
GLOVE BIO SURGEON STRL SZ8 (GLOVE) ×8 IMPLANT
GLOVE INDICATOR 8.0 STRL GRN (GLOVE) ×4 IMPLANT
GOWN STRL REUS W/ TWL LRG LVL3 (GOWN DISPOSABLE) ×1 IMPLANT
GOWN STRL REUS W/ TWL XL LVL3 (GOWN DISPOSABLE) ×1 IMPLANT
GOWN STRL REUS W/TWL LRG LVL3 (GOWN DISPOSABLE) ×1
GOWN STRL REUS W/TWL XL LVL3 (GOWN DISPOSABLE) ×1
HANDPIECE SUCTION TUBG SURGILV (MISCELLANEOUS) ×2 IMPLANT
HOOD PEEL AWAY FLYTE STAYCOOL (MISCELLANEOUS) ×4 IMPLANT
IV NS 100ML SINGLE PACK (IV SOLUTION) IMPLANT
NDL SAFETY 18GX1.5 (NEEDLE) ×2 IMPLANT
NEEDLE FILTER BLUNT 18X 1/2SAF (NEEDLE) ×1
NEEDLE FILTER BLUNT 18X1 1/2 (NEEDLE) ×1 IMPLANT
NEEDLE SPNL 20GX3.5 QUINCKE YW (NEEDLE) ×2 IMPLANT
NS IRRIG 1000ML POUR BTL (IV SOLUTION) ×2 IMPLANT
PACK HIP PROSTHESIS (MISCELLANEOUS) ×2 IMPLANT
PILLOW ABDUC SM (MISCELLANEOUS) ×2 IMPLANT
SOL .9 NS 3000ML IRR  AL (IV SOLUTION) ×3
SOL .9 NS 3000ML IRR UROMATIC (IV SOLUTION) ×3 IMPLANT
STAPLER SKIN PROX 35W (STAPLE) ×2 IMPLANT
STRAP SAFETY BODY (MISCELLANEOUS) ×2 IMPLANT
SUT ETHIBOND 2 V 37 (SUTURE) ×4 IMPLANT
SUT ETHIBOND CT1 BRD #0 30IN (SUTURE) ×2 IMPLANT
SUT FIBERWIRE #2 38 T-5 BLUE (SUTURE) ×2
SUT QUILL PDO 2 24X24 VLT (SUTURE) ×2 IMPLANT
SUT VIC AB 1 CT1 36 (SUTURE) ×2 IMPLANT
SUT VIC AB 2-0 CT1 27 (SUTURE) ×2
SUT VIC AB 2-0 CT1 TAPERPNT 27 (SUTURE) ×2 IMPLANT
SUTURE FIBERWR #2 38 T-5 BLUE (SUTURE) ×1 IMPLANT
SYR 30ML LL (SYRINGE) ×4 IMPLANT
SYR TB 1ML 27GX1/2 LL (SYRINGE) IMPLANT
SYRINGE 10CC LL (SYRINGE) ×2 IMPLANT
TAPE TRANSPORE STRL 2 31045 (GAUZE/BANDAGES/DRESSINGS) ×2 IMPLANT

## 2015-09-16 NOTE — Progress Notes (Signed)
Notified family of occurrence and requested family to come and sit with patient

## 2015-09-16 NOTE — Clinical Social Work Note (Signed)
Clinical Social Work Assessment  Patient Details  Name: Jillian Horn MRN: [id number] Date of Birth: [birth year]  Date of referral:  09/16/15               Reason for consult:  Discharge Planning                Permission sought to share information with:  Family Supports Permission granted to share information::  Yes, Verbal Permission Granted  Name::        Agency::     Relationship::   ([place])  Contact Information:     Housing/Transportation Living arrangements for the past 2 months:  [employer] [employer]) Source of Information:  Adult Children Patient Interpreter Needed:  None Criminal Activity/Legal Involvement Pertinent to Current Situation/Hospitalization:  No - Comment as needed Significant Relationships:  Adult Children, Other Family Members Lives with:  Facility Resident [facility]) Do you feel safe going back to the place where you live?  Yes Need for family participation in patient care:  Yes (Comment) ([place])  Care giving concerns:  Patient is a resident at [facility] but PT is recommending SNF.    Social Worker assessment / plan:  CSW met with patient and family at bedside. Introduced herself and her role. Per patient's family patient is a resident at [facility]. Reported that they are in agreement with PTs recommendation that patient will need SNF at discharge. Reported they are unfamiliar with SNFs in [facility]. CSW provided SNF list. CSW encouraged patient's family to visit facilities and to look on Medicare.gov to see facility ratings. Granted CSW verbal permission to complete a SNF bed search and to communicate discharge plans with [facility]. FL2/ PASRR completed and placed in MD's basket for cosign. SNF referral sent to SNFs in [facility]. Awaiting bed offers. CSW will continue to follow and assist.   Employment status:  Retired Nurse, adult PT Recommendations:   [place] / Referral to community resources:  [place]  Patient/Family's Response to care:  Patient's family is in agreement for SNF placement at [facility].   Patient/Family's Understanding of and Emotional Response to Diagnosis, Current Treatment, and Prognosis:  Patient's family understands that patient will benfit from SNF placement and is appreciative of CSW's assistance.   Emotional Assessment Appearance:  Appears stated age Attitude/Demeanor/Rapport:   (None) Affect (typically observed):  Calm, Pleasant Orientation:  Oriented to Self Alcohol / Substance use:  Not Applicable Psych involvement (Current and /or in the community):  No (Comment)  Discharge Needs  Concerns to be addressed:  Discharge Planning Concerns Readmission within the last 30 days:  No Current discharge risk:  Chronically ill Barriers to Discharge:  Continued Medical Work up   [employer], LCSW 09/16/2015, 3:35 PM

## 2015-09-16 NOTE — Progress Notes (Signed)
Honeycombed dressing intact without drainage, of urine. Feeling to mid thigh region.

## 2015-09-16 NOTE — Progress Notes (Signed)
Patient ID: Jillian Horn, female   DOB: August 17, 1929, 82 y.o.   MRN: [id number] [facility] -  at [facility]   PATIENT NAME: Jillian Horn    MR#:  [id number]  DATE OF BIRTH:  1929-06-29  SUBJECTIVE:  Came in from [place] house after had a mechanical fall. Patient does not remember what caused the fall. No loss of consciousness. She is going for surgery today. Spoke with family.  REVIEW OF SYSTEMS:   Review of Systems  Constitutional: Negative for fever, chills and weight loss.  HENT: Negative for ear discharge, ear pain and nosebleeds.   Eyes: Negative for blurred vision, pain and discharge.  Respiratory: Negative for sputum production, shortness of breath, wheezing and stridor.   Cardiovascular: Negative for chest pain, palpitations, orthopnea and PND.  Gastrointestinal: Negative for nausea, vomiting, abdominal pain and diarrhea.  Genitourinary: Negative for urgency and frequency.  Musculoskeletal: Positive for joint pain and falls. Negative for back pain.  Neurological: Positive for weakness. Negative for sensory change, speech change and focal weakness.  Psychiatric/Behavioral: Negative for depression and hallucinations. The patient is not nervous/anxious.    Tolerating Diet:npo Tolerating PT: pending  DRUG ALLERGIES:   Allergies  Allergen Reactions  . Codeine Sulfate Other (See Comments)    Reaction:  Unknown   . Lidocaine Other (See Comments)    Reaction:  Unknown   . Sulfa Antibiotics Other (See Comments)    Reaction:  Unknown   . Sulfasalazine Other (See Comments)    Reaction:  Unknown     VITALS:  Blood pressure 139/60, pulse 78, temperature 98.4 F (36.9 C), temperature source Oral, resp. rate 18, height 5\' 4"  (1.626 m), weight 59.875 kg (132 lb), SpO2 90 %.  PHYSICAL EXAMINATION:   Physical Exam  GENERAL:  82 y.o.-year-old patient lying in the bed with no acute distress.  EYES: Pupils equal, round, reactive to light and  accommodation. No scleral icterus. Extraocular muscles intact.  HEENT: Head atraumatic, normocephalic. Oropharynx and nasopharynx clear.  NECK:  Supple, no jugular venous distention. No thyroid enlargement, no tenderness.  LUNGS: Normal breath sounds bilaterally, no wheezing, rales, rhonchi. No use of accessory muscles of respiration.  CARDIOVASCULAR: S1, S2 normal. No murmurs, rubs, or gallops.  ABDOMEN: Soft, nontender, nondistended. Bowel sounds present. No organomegaly or mass.  EXTREMITIES: No cyanosis, clubbing or edema b/l.   NEUROLOGIC: non focal. Unable to  Do much due to hip pain PSYCHIATRIC:  patient is alert and oriented x 2.  SKIN: No obvious rash, lesion, or ulcer.   LABORATORY PANEL:  CBC  Recent Labs Lab 09/16/15 0516  WBC 8.7  HGB 10.3*  HCT 30.1*  PLT 239    Chemistries   Recent Labs Lab 09/15/15 1530 09/16/15 0516  NA 142 141  K 3.9 3.8  CL 106 108  CO2 27 27  GLUCOSE 107* 101*  BUN 17 13  CREATININE 0.94 0.85  CALCIUM 8.9 8.5*  AST 15  --   ALT 12*  --   ALKPHOS 61  --   BILITOT 0.5  --    Cardiac Enzymes No results for input(s): TROPONINI in the last 168 hours. RADIOLOGY:  Dg Chest 1 View  09/15/2015  CLINICAL DATA:  Right hip fracture. EXAM: CHEST 1 VIEW COMPARISON:  Two-view chest x-ray 05/04/2009 FINDINGS: A prominent hiatal hernia is again seen. The heart is enlarged. Lung volumes are low. No focal airspace disease is present. There is no edema or effusion to suggest failure. IMPRESSION:  Bone 1. Stable cardiomegaly without failure. 2. Low lung volumes. 3. Large hiatal hernia. Electronically Signed   By: Marin Roberts M.D.   On: 09/15/2015 15:33   Ct Head Wo Contrast  09/15/2015  CLINICAL DATA:  Recent fall EXAM: CT HEAD WITHOUT CONTRAST TECHNIQUE: Contiguous axial images were obtained from the base of the skull through the vertex without intravenous contrast. COMPARISON:  April 18, 2015 FINDINGS: Brain: Mild diffuse atrophy is stable.  There is no intracranial mass, hemorrhage, extra-axial fluid collection, or midline shift. There is patchy small vessel disease throughout the centra semiovale bilaterally, stable. Elsewhere gray-white compartments appear unremarkable. No acute infarct evident. Vascular: There is calcification in both cavernous carotid artery regions. Skull: The bony calvarium appears intact. Sinuses/Orbits: Orbits appear symmetric bilaterally. Paranasal sinuses which are visualized are clear. Other: Mastoid air cells are clear. IMPRESSION: Atrophy with extensive supratentorial small vessel disease, stable. No acute infarct evident. No intracranial mass, hemorrhage, or extra-axial fluid collection. There is cavernous carotid artery calcification bilaterally. Electronically Signed   By: Bretta Bang III M.D.   On: 09/15/2015 15:22   Dg Hip Unilat  With Pelvis 2-3 Views Right  09/15/2015  CLINICAL DATA:  Pain in right hip after fall. EXAM: DG HIP (WITH OR WITHOUT PELVIS) 2-3V RIGHT COMPARISON:  None. FINDINGS: There is foreshortening of the right femoral neck with a step-off both laterally and medially concerning for subcapital fracture with impaction. No other acute abnormalities. IMPRESSION: The findings are highly concerning for a subcapital fracture with impaction. Electronically Signed   By: Gerome Sam III M.D   On: 09/15/2015 15:26   ASSESSMENT AND PLAN:  Jillian Horn is a 82 y.o. female with a known history of Dementia, rheumatoid arthritis and anemia. The patient fell by accident in skilled nursing facility. She complained of right hip pain but denies any headache, dizziness, syncope or loss of consciousness.   1.Acute Right hip fracture status post mechanical fall - patient has moderate risk for hip fracture surgery. -Orthopedic consult appreciated - Keep nothing by mouth with IV fluid support, pain control, hold DVT prophylaxis for surgery. - DVT prophylaxis and PT after surgery.  2. UTI. Urinalysis  show UTI.  -on IV Rocephin in the follow-up urine culture.  3.Dementia. Continue home medications  Case discussed with Care Management/Social Worker. Management plans discussed with the patient, family and they are in agreement.  CODE STATUS: Full  DVT Prophylaxis: per ortho  TOTAL TIME TAKING CARE OF THIS PATIENT:25 minutes.  >50% time spent on counselling and coordination of care POSSIBLE D/C IN 2 DAYS, DEPENDING ON CLINICAL CONDITION.  Note: This dictation was prepared with Dragon dictation along with smaller phrase technology. Any transcriptional errors that result from this process are unintentional.  Karelyn Brisby M.D on 09/16/2015 at 7:58 AM  Between 7am to 6pm - Pager - [phone]  After 6pm go to [email or web address] - password [email or web address] [facility]  [place] [facility]  Office  [phone]  CC: Primary care physician; Dale [place], MD

## 2015-09-16 NOTE — [place] FL2 (Signed)
[place] MEDICAID FL2 LEVEL OF CARE SCREENING TOOL     IDENTIFICATION  Patient Name: Jillian Horn Birthdate: December 27, 1929 Sex: female Admission Date (Current Location): 09/15/2015  [place] and [place] Number:  Chiropodist and Address:  [facility], [address], [place], [place] [address]      Provider Number: [id number]  Attending Physician Name and Address:  Enedina Finner, MD  Relative Name and Phone Number:       Current Level of Care: Hospital Recommended Level of Care: Skilled Nursing Facility Prior Approval Number:    Date Approved/Denied:   PASRR Number:  [id number])  Discharge Plan: SNF    Current Diagnoses: Patient Active Problem List   Diagnosis Date Noted  . Hip fracture (HCC) 09/15/2015  . Noncompliance with medications 11/08/2014  . Health care maintenance 05/08/2014  . Rheumatoid arthritis (HCC) 08/10/2012  . Anemia 08/10/2012  . Osteoporosis 08/10/2012  . Hypercholesterolemia 08/10/2012  . Dementia 08/10/2012  . Hiatal hernia 08/10/2012    Orientation RESPIRATION BLADDER Height & Weight     Self  O2 (Nasal Cannula 1 (L/min) ) Continent Weight: 132 lb (59.875 kg) Height:  5\' 4"  (162.6 cm)  BEHAVIORAL SYMPTOMS/MOOD NEUROLOGICAL BOWEL NUTRITION STATUS   (None)  (None) Continent Diet (clear liquid )  AMBULATORY STATUS COMMUNICATION OF NEEDS Skin   Extensive Assist Verbally Other (Comment) (Closed incision Right Hip)                       Personal Care Assistance Level of Assistance  Bathing, Feeding, Dressing Bathing Assistance: Limited assistance Feeding assistance: Independent Dressing Assistance: Limited assistance     Functional Limitations Info  Sight, Hearing, Speech Sight Info: Adequate Hearing Info: Adequate Speech Info: Adequate    SPECIAL CARE FACTORS FREQUENCY  PT (By licensed PT), OT (By licensed OT)     PT Frequency:  (5) OT Frequency:  (5)            Contractures       Additional Factors Info  Allergies, Code Status Code Status Info:  (Full Code) Allergies Info:  (Codeine Sulfate, Lidocaine, Sulfa Antibiotics, Sulfasalazine)           Current Medications (09/16/2015):  This is the current hospital active medication list Current Facility-Administered Medications  Medication Dose Route Frequency Provider Last Rate Last Dose  . acetaminophen (TYLENOL) tablet 650 mg  650 mg Oral Q6H PRN 11/17/2015, MD       Or  . acetaminophen (TYLENOL) suppository 650 mg  650 mg Rectal Q6H PRN Christena Flake, MD      . acetaminophen (TYLENOL) tablet 1,000 mg  1,000 mg Oral Q6H Christena Flake, MD   1,000 mg at 09/16/15 1213  . albuterol (PROVENTIL) (2.5 MG/3ML) 0.083% nebulizer solution 2.5 mg  2.5 mg Nebulization Q2H PRN 11/17/15, MD      . amLODipine (NORVASC) tablet 2.5 mg  2.5 mg Oral Daily Shaune Pollack, MD   2.5 mg at 09/15/15 1715  . bisacodyl (DULCOLAX) suppository 10 mg  10 mg Rectal Daily PRN 09/17/15, MD      . ceFAZolin (ANCEF) IVPB 2g/100 mL premix  2 g Intravenous Q6H Christena Flake, MD   2 g at 09/16/15 1214  . cefTRIAXone (ROCEPHIN) 1 g in dextrose 5 % 50 mL IVPB  1 g Intravenous Q24H 11/17/15, MD   1 g at 09/15/15 2107  . cyanocobalamin tablet 500 mcg  500 mcg Oral Daily Shaune Pollack, MD   500 mcg at 09/15/15 1715  . dextrose 5 % and 0.9 % NaCl with KCl 20 mEq/L infusion   Intravenous Continuous Christena Flake, MD 75 mL/hr at 09/16/15 1214    . diphenhydrAMINE (BENADRYL) 12.5 MG/5ML elixir 12.5-25 mg  12.5-25 mg Oral Q4H PRN Christena Flake, MD      . docusate sodium (COLACE) capsule 100 mg  100 mg Oral BID Christena Flake, MD   100 mg at 09/16/15 1213  . donepezil (ARICEPT) tablet 10 mg  10 mg Oral Daily Shaune Pollack, MD   10 mg at 09/15/15 1715  . [START ON 09/17/2015] enoxaparin (LOVENOX) injection 40 mg  40 mg Subcutaneous Q24H Christena Flake, MD      . escitalopram (LEXAPRO) tablet 20 mg  20 mg Oral Daily Shaune Pollack, MD   20 mg at 09/15/15 1715  . ferrous sulfate tablet  325 mg  325 mg Oral Q breakfast Shaune Pollack, MD   325 mg at 09/16/15 0800  . HYDROmorphone (DILAUDID) injection 0.5-1 mg  0.5-1 mg Intravenous Q2H PRN Christena Flake, MD      . LORazepam (ATIVAN) tablet 0.5 mg  0.5 mg Oral Q8H PRN Shaune Pollack, MD      . magnesium hydroxide (MILK OF MAGNESIA) suspension 30 mL  30 mL Oral Daily PRN Christena Flake, MD      . memantine [facility]) tablet 10 mg  10 mg Oral BID Shaune Pollack, MD   10 mg at 09/15/15 2107  . metoCLOPramide (REGLAN) tablet 5-10 mg  5-10 mg Oral Q8H PRN Christena Flake, MD       Or  . metoCLOPramide (REGLAN) injection 5-10 mg  5-10 mg Intravenous Q8H PRN Christena Flake, MD      . ondansetron [facility]) tablet 4 mg  4 mg Oral Q6H PRN Christena Flake, MD       Or  . ondansetron [facility]) injection 4 mg  4 mg Intravenous Q6H PRN Christena Flake, MD      . oxyCODONE (Oxy IR/ROXICODONE) immediate release tablet 5-10 mg  5-10 mg Oral Q3H PRN Christena Flake, MD      . pantoprazole (PROTONIX) EC tablet 40 mg  40 mg Oral Daily Christena Flake, MD   40 mg at 09/16/15 1213  . sodium phosphate (FLEET) 7-19 GM/118ML enema 1 enema  1 enema Rectal Once PRN Christena Flake, MD         Discharge Medications: Please see discharge summary for a list of discharge medications.  Relevant Imaging Results:  Relevant Lab Results:   Additional Information  (SSN [id number])  Verta Ellen Kreig Parson, LCSW

## 2015-09-16 NOTE — Anesthesia Procedure Notes (Addendum)
Spinal Patient location during procedure: OR Start time: 09/16/2015 8:15 AM End time: 09/16/2015 8:24 AM Staffing Performed by: anesthesiologist  Preanesthetic Checklist Completed: patient identified, site marked, surgical consent, pre-op evaluation, timeout performed, IV checked, risks and benefits discussed and monitors and equipment checked Spinal Block Patient position: sitting Prep: Betadine Patient monitoring: heart rate, continuous pulse ox, blood pressure and cardiac monitor Approach: midline Location: L4-5 Injection technique: single-shot Needle Needle type: Whitacre and Introducer  Needle gauge: 25 G Needle length: 9 cm Needle insertion depth: 7 cm Additional Notes Negative paresthesia. Negative blood return. Positive free-flowing CSF. Expiration date of kit checked and confirmed. Patient tolerated procedure well, without complications.    Date/Time: 09/16/2015 8:15 AM Performed by: Allean Found Pre-anesthesia Checklist: Patient identified, Emergency Drugs available, Suction available, Patient being monitored and Timeout performed Oxygen Delivery Method: Nasal cannula Dental Injury: Teeth and Oropharynx as per pre-operative assessment

## 2015-09-16 NOTE — Progress Notes (Signed)
This writer heard something coming from room, assessed patient and noted skin tear to left arm measuring 4cm x 2.5cm, water jug slung across room, ID tags, alert tags, and IV removed by patient. Notified MD of said above, no sitter at this time, restart IV. Will contact family.

## 2015-09-16 NOTE — Transfer of Care (Signed)
Immediate Anesthesia Transfer of Care Note  Patient: Jillian Horn  Procedure(s) Performed: Procedure(s): ARTHROPLASTY BIPOLAR HIP (HEMIARTHROPLASTY) (Right)  Patient Location: PACU  Anesthesia Type:Spinal  Level of Consciousness: sedated  Airway & Oxygen Therapy: Patient Spontanous Breathing and Patient connected to nasal cannula oxygen  Post-op Assessment: Report given to RN and Post -op Vital signs reviewed and stable  Post vital signs: Reviewed and stable  Last Vitals:  Filed Vitals:   09/15/15 1940 09/16/15 0447  BP: 133/58 139/60  Pulse: 79 78  Temp: 37.6 C 36.9 C  Resp: 18 18    Last Pain:  Filed Vitals:   09/16/15 0447  PainSc: 0-No pain         Complications: No apparent anesthesia complications

## 2015-09-16 NOTE — Op Note (Signed)
09/15/2015 - 09/16/2015  9:59 AM  Patient:   Jillian Horn  Pre-Op Diagnosis:   Displaced femoral neck fracture, right hip.  Post-Op Diagnosis:   Same.  Procedure:   Right hip unipolar hemiarthroplasty.  Surgeon:   Maryagnes Amos, MD  Assistant:   Dedra Skeens, PA-C  Anesthesia:   Spinal  Findings:   As above.  Complications:   None  EBL:   75 cc  Fluids:   600 cc crystalloid  UOP:   200 cc  TT:   None  Drains:   None  Closure:   Staples  Implants:   Biomet press-fit system with a #11 standard offset Echo femoral stem, a 46 mm outer diameter shell, a 28 mm head, and a +0 mm neck  Brief Clinical Note:   The patient is an 82 year old female who sustained the above-noted injury yesterday when she fell at her assisted living facility. She was brought to the emergency room where x-rays demonstrated a displaced right femoral neck fracture. The patient has been cleared medically and presents at this time for definitive management of the injury.  Procedure:   The patient was brought into the operating room. After adequate spinal anesthesia was obtained, the patient was repositioned in the left lateral decubitus position and secured using a lateral hip positioner. The right hip and lower extremity were prepped with ChloroPrep solution before being draped sterilely. Preoperative antibiotics were administered. A timeout was performed to verify the appropriate surgical site before a standard posterior approach to the hip was made through an approximately 4-5 inch incision. The incision was carried down through the subcutaneous tissues to expose the gluteal fascia and proximal end of the iliotibial band. These structures were split the length of the incision and the Charnley self-retaining hip retractor placed. The bursal tissues were swept posteriorly to expose the short external rotators. The anterior border of the piriformis tendon was identified and this plane developed down through the  capsule to enter the joint. Abundant fracture hematoma was suctioned. A flap of tissue was elevated off the posterior aspect of the femoral neck and greater trochanter and retracted posteriorly. This flap included the piriformis tendon, the short external rotators, and the posterior capsule. The femoral head was removed in its entirety, then taken to the back table where it was measured and found to be optimally replicated by a 46 mm head. The appropriate trial head was inserted and found to demonstrate an excellent suction fit.   Attention was directed to the femoral side. The femoral neck was recut 10-12 mm above the lesser trochanter using an oscillating saw. The piriformis fossa was debrided of soft tissues before the intramedullary canal was accessed through this point using a triple step reamer. The canal was reamed sequentially beginning with a #7 tapered reamer and progressing to a #11 tapered reamer. This provided excellent circumferential chatter. A box osteotome was used to establish version before the canal was broached sequentially beginning with a #7 broach and progressing to a #11 broach. This was left in place and several trial reductions performed. The permanent #11 standard offset femoral stem was impacted into place. A repeat trial reduction was performed using both the -6 mm and +0 mm neck lengths. The +0 mm neck length demonstrated excellent stability both in extension and external rotation as well as with flexion to 90 and internal rotation beyond 70. It also was stable in the position of sleep. The 46 mm outer diameter shell with the +0  mm neck adapter construct was put together on the back table before being impacted onto the stem of the femoral component. The Morse taper locking mechanism was verified using manual distraction before the head was relocated and placed through a range of motion with the findings as described above.  The wound was copiously irrigated with bacitracin saline  solution via the jet lavage system before the peri-incisional and pericapsular tissues were injected with 30 cc of 0.5% Sensorcaine with epinephrine and 20 cc of Exparel diluted out to 60 cc with normal saline to help with postoperative analgesia. The posterior flap was reapproximated to the posterior aspect of the greater trochanter using #2 Tycron interrupted sutures placed through drill holes. Several additional #2 Tycron interrupted sutures were used to reinforce this layer of closure. The iliotibial band was reapproximated using #1 Vicryl interrupted sutures before the gluteal fascia was closed using a running #1 Vicryl suture. At this point, 1 g of transexemic acid in 10 cc of normal saline was injected into the joint to help reduce postoperative bleeding. The subcutaneous tissues were closed in several layers using 2-0 Vicryl interrupted sutures before the skin was closed using staples. A sterile occlusive dressing was applied to the wound before the patient was placed into an abduction wedge pillow. The patient was then rolled back into the supine position on the hospital bed before being awakened and returned to the recovery room in satisfactory condition after tolerating the procedure well.

## 2015-09-16 NOTE — Anesthesia Preprocedure Evaluation (Addendum)
Anesthesia Evaluation  Patient identified by MRN, date of birth, ID band Patient awake and Patient confused    Reviewed: Allergy & Precautions, NPO status , Patient's Chart, lab work & pertinent test results  History of Anesthesia Complications Negative for: history of anesthetic complications  Airway Mallampati: II       Dental   Pulmonary COPD,           Cardiovascular hypertension, Pt. on medications      Neuro/Psych PSYCHIATRIC DISORDERS (Dementia) negative neurological ROS     GI/Hepatic Neg liver ROS, hiatal hernia,   Endo/Other  negative endocrine ROS  Renal/GU negative Renal ROS     Musculoskeletal  (+) Arthritis , Osteoarthritis,    Abdominal   Peds  Hematology  (+) anemia ,   Anesthesia Other Findings   Reproductive/Obstetrics                            Anesthesia Physical Anesthesia Plan  ASA: III and emergent  Anesthesia Plan: Spinal   Post-op Pain Management:    Induction:   Airway Management Planned:   Additional Equipment:   Intra-op Plan:   Post-operative Plan:   Informed Consent: I have reviewed the patients History and Physical, chart, labs and discussed the procedure including the risks, benefits and alternatives for the proposed anesthesia with the patient or authorized representative who has indicated his/her understanding and acceptance.     Plan Discussed with:   Anesthesia Plan Comments:        Anesthesia Quick Evaluation

## 2015-09-16 NOTE — Progress Notes (Signed)
Dr. Ether Griffins assessed patient's toes and nails prior to surgery, no new orders at this time. MD ok to see patient in outpatient clinic.

## 2015-09-17 LAB — BASIC METABOLIC PANEL
Anion gap: 4 — ABNORMAL LOW (ref 5–15)
BUN: 9 mg/dL (ref 6–20)
CALCIUM: 8 mg/dL — AB (ref 8.9–10.3)
CO2: 25 mmol/L (ref 22–32)
CREATININE: 0.83 mg/dL (ref 0.44–1.00)
Chloride: 109 mmol/L (ref 101–111)
GFR calc Af Amer: >60 mL/min (ref >60)
GLUCOSE: 117 mg/dL — AB (ref 65–99)
Potassium: 3.8 mmol/L (ref 3.5–5.1)
SODIUM: 138 mmol/L (ref 135–145)

## 2015-09-17 LAB — CBC WITH DIFFERENTIAL/PLATELET
BASOS ABS: 0.1 10*3/uL (ref 0–0.1)
BASOS PCT: 1 %
EOS ABS: 0.2 10*3/uL (ref 0–0.7)
Eosinophils Relative: 2 %
HEMATOCRIT: 29.7 % — AB (ref 35.0–47.0)
HEMOGLOBIN: 9.9 g/dL — AB (ref 12.0–16.0)
Lymphocytes Relative: 7 %
Lymphs Abs: 0.7 10*3/uL — ABNORMAL LOW (ref 1.0–3.6)
MCH: 30.9 pg (ref 26.0–34.0)
MCHC: 33.3 g/dL (ref 32.0–36.0)
MCV: 92.9 fL (ref 80.0–100.0)
MONOS PCT: 4 %
Monocytes Absolute: 0.4 10*3/uL (ref 0.2–0.9)
NEUTROS ABS: 8.9 10*3/uL — AB (ref 1.4–6.5)
NEUTROS PCT: 88 %
PLATELETS: 234 10*3/uL (ref 150–440)
RBC: 3.2 MIL/uL — ABNORMAL LOW (ref 3.80–5.20)
RDW: 14.7 % — AB (ref 11.5–14.5)
WBC: 10.2 10*3/uL (ref 3.6–11.0)

## 2015-09-17 LAB — URINE CULTURE: SPECIAL REQUESTS: NORMAL

## 2015-09-17 MED ORDER — CEPHALEXIN 250 MG PO CAPS
500.0000 mg | ORAL_CAPSULE | Freq: Two times a day (BID) | ORAL | Status: DC
  Administered 2015-09-17 – 2015-09-18 (×3): 500 mg via ORAL
  Filled 2015-09-17 (×3): qty 2

## 2015-09-17 NOTE — Progress Notes (Signed)
  Subjective: 1 Day Post-Op Procedure(s) (LRB): ARTHROPLASTY BIPOLAR HIP (HEMIARTHROPLASTY) (Right) Patient reports pain as mild.   Patient seen in rounds with Dr. Joice Lofts. Patient is well, and has had no acute complaints or problems. The patient did have some confusion in the night and family is now staying with her.  Plan is to go Skilled nursing facility after hospital stay. Negative for chest pain and shortness of breath Fever: no Gastrointestinal: Negative for nausea and vomiting  Objective: Vital signs in last 24 hours: Temp:  [97.6 F (36.4 C)-99.5 F (37.5 C)] 99.5 F (37.5 C) (07/02 0432) Pulse Rate:  [58-77] 77 (07/02 0432) Resp:  [14-23] 18 (07/02 0432) BP: (100-146)/(42-69) 146/69 mmHg (07/02 0432) SpO2:  [91 %-100 %] 97 % (07/02 0432)  Intake/Output from previous day:  Intake/Output Summary (Last 24 hours) at 09/17/15 0623 Last data filed at 09/17/15 0030  Gross per 24 hour  Intake 1043.75 ml  Output    925 ml  Net 118.75 ml    Intake/Output this shift: Total I/O In: -  Out: 450 [Urine:450]  Labs:  Recent Labs  09/15/15 1530 09/16/15 0516 09/17/15 0402  HGB 11.7* 10.3* 9.9*    Recent Labs  09/16/15 0516 09/17/15 0402  WBC 8.7 10.2  RBC 3.24* 3.20*  HCT 30.1* 29.7*  PLT 239 234    Recent Labs  09/16/15 0516 09/17/15 0402  NA 141 138  K 3.8 3.8  CL 108 109  CO2 27 25  BUN 13 9  CREATININE 0.85 0.83  GLUCOSE 101* 117*  CALCIUM 8.5* 8.0*    Recent Labs  09/15/15 1530  INR 1.08     EXAM General - Patient is Confused but alert Extremity - Sensation intact distally Intact pulses distally Compartment soft Dressing/Incision - clean, dry, no drainage Motor Function - intact, moving foot and toes well on exam.   Past Medical History  Diagnosis Date  . Rheumatoid arthritis(714.0)     seropositive, oligo-articular.  MTX, s/p plaquenil, cytopenia on sulfasalazine, s/p remicade  . Osteoporosis     s/p fosamax, s/p forteo, reclast   . Diverticulosis   . Anemia     erosion on EGD  . Hiatal hernia   . Hypercholesterolemia   . Dementia     Assessment/Plan: 1 Day Post-Op Procedure(s) (LRB): ARTHROPLASTY BIPOLAR HIP (HEMIARTHROPLASTY) (Right) Principal Problem:   Hip fracture (HCC)  Estimated body mass index is 22.65 kg/(m^2) as calculated from the following:   Height as of this encounter: 5\' 4"  (1.626 m).   Weight as of this encounter: 59.875 kg (132 lb). Advance diet Up with therapy Discharge to SNF when cleared medically  DVT Prophylaxis - Lovenox, Foot Pumps and TED hose Weight-Bearing as tolerated to right leg  , PA-C Orthopaedic Surgery 09/17/2015, 6:23 AM

## 2015-09-17 NOTE — Progress Notes (Signed)
Clinical Education officer, museum (CSW) met with patient and her daughter in law and presented bed offers. Per daughter in law they prefer [facility] has not responded to referral yet. CSW also explained that [facility] will have to approve SNF stay. Per daughter in law in [place] does not offer [employer] [facility] will be the second choice. CSW will continue to follow and assist as needed.   Blima Rich, LCSW [phone]

## 2015-09-17 NOTE — Progress Notes (Signed)
Weaning of O2 in progress 94% on 1L. Removed to RA, will reevaluate. No distress at this time.

## 2015-09-17 NOTE — Progress Notes (Signed)
Alerted MD to assess area on vaginal area, no concerns at this time per MD, will continue to monitor. Patient OOB to chair. O2 weaning in process per order, monitoring in place.

## 2015-09-17 NOTE — Progress Notes (Signed)
Patient ID: Jillian Horn, female   DOB: [birth year], 82 y.o.   MRN: [id number] [facility] [place] [facility]   PATIENT NAME: Jillian Horn    MR#:  [id number]  DATE OF BIRTH:  Dec 26, 1929  SUBJECTIVE:  Came in from [place] house after had a mechanical fall. Patient does not remember what caused the fall. No loss of consciousness.  -POD #1 -per RN pulled out 2 IV lines Spoke with family.  REVIEW OF SYSTEMS:   Review of Systems  Constitutional: Negative for fever, chills and weight loss.  HENT: Negative for ear discharge, ear pain and nosebleeds.   Eyes: Negative for blurred vision, pain and discharge.  Respiratory: Negative for sputum production, shortness of breath, wheezing and stridor.   Cardiovascular: Negative for chest pain, palpitations, orthopnea and PND.  Gastrointestinal: Negative for nausea, vomiting, abdominal pain and diarrhea.  Genitourinary: Negative for urgency and frequency.  Musculoskeletal: Positive for joint pain and falls. Negative for back pain.  Neurological: Positive for weakness. Negative for sensory change, speech change and focal weakness.  Psychiatric/Behavioral: Negative for depression and hallucinations. The patient is not nervous/anxious.    Tolerating Diet:regular Tolerating PT: rehab  DRUG ALLERGIES:   Allergies  Allergen Reactions  . Codeine Sulfate Other (See Comments)    Reaction:  Unknown   . Lidocaine Other (See Comments)    Reaction:  Unknown   . Sulfa Antibiotics Other (See Comments)    Reaction:  Unknown   . Sulfasalazine Other (See Comments)    Reaction:  Unknown     VITALS:  Blood pressure 139/72, pulse 81, temperature 99.1 F (37.3 C), temperature source Oral, resp. rate 16, height 5\' 4"  (1.626 m), weight 59.875 kg (132 lb), SpO2 96 %.  PHYSICAL EXAMINATION:   Physical Exam  GENERAL:  82 y.o.-year-old patient lying in the bed with no acute distress.  EYES: Pupils equal, round, reactive to  light and accommodation. No scleral icterus. Extraocular muscles intact.  HEENT: Head atraumatic, normocephalic. Oropharynx and nasopharynx clear.  NECK:  Supple, no jugular venous distention. No thyroid enlargement, no tenderness.  LUNGS: Normal breath sounds bilaterally, no wheezing, rales, rhonchi. No use of accessory muscles of respiration.  CARDIOVASCULAR: S1, S2 normal. No murmurs, rubs, or gallops.  ABDOMEN: Soft, nontender, nondistended. Bowel sounds present. No organomegaly or mass.  EXTREMITIES: No cyanosis, clubbing or edema b/l.   NEUROLOGIC: non focal. Unable to  Do much due to hip pain PSYCHIATRIC:  patient is alert and oriented x 2.  SKIN: No obvious rash, lesion, or ulcer.   LABORATORY PANEL:  CBC  Recent Labs Lab 09/17/15 0402  WBC 10.2  HGB 9.9*  HCT 29.7*  PLT 234    Chemistries   Recent Labs Lab 09/15/15 1530  09/17/15 0402  NA 142  < > 138  K 3.9  < > 3.8  CL 106  < > 109  CO2 27  < > 25  GLUCOSE 107*  < > 117*  BUN 17  < > 9  CREATININE 0.94  < > 0.83  CALCIUM 8.9  < > 8.0*  AST 15  --   --   ALT 12*  --   --   ALKPHOS 61  --   --   BILITOT 0.5  --   --   < > = values in this interval not displayed. Cardiac Enzymes No results for input(s): TROPONINI in the last 168 hours. RADIOLOGY:  Dg Chest 1 View  09/15/2015  CLINICAL DATA:  Right hip fracture. EXAM: CHEST 1 VIEW COMPARISON:  Two-view chest x-ray 05/04/2009 FINDINGS: A prominent hiatal hernia is again seen. The heart is enlarged. Lung volumes are low. No focal airspace disease is present. There is no edema or effusion to suggest failure. IMPRESSION: Bone 1. Stable cardiomegaly without failure. 2. Low lung volumes. 3. Large hiatal hernia. Electronically Signed   By: Marin Roberts M.D.   On: 09/15/2015 15:33   Ct Head Wo Contrast  09/15/2015  CLINICAL DATA:  Recent fall EXAM: CT HEAD WITHOUT CONTRAST TECHNIQUE: Contiguous axial images were obtained from the base of the skull through the  vertex without intravenous contrast. COMPARISON:  April 18, 2015 FINDINGS: Brain: Mild diffuse atrophy is stable. There is no intracranial mass, hemorrhage, extra-axial fluid collection, or midline shift. There is patchy small vessel disease throughout the centra semiovale bilaterally, stable. Elsewhere gray-white compartments appear unremarkable. No acute infarct evident. Vascular: There is calcification in both cavernous carotid artery regions. Skull: The bony calvarium appears intact. Sinuses/Orbits: Orbits appear symmetric bilaterally. Paranasal sinuses which are visualized are clear. Other: Mastoid air cells are clear. IMPRESSION: Atrophy with extensive supratentorial small vessel disease, stable. No acute infarct evident. No intracranial mass, hemorrhage, or extra-axial fluid collection. There is cavernous carotid artery calcification bilaterally. Electronically Signed   By: Bretta Bang III M.D.   On: 09/15/2015 15:22   Dg Hip Port Unilat With Pelvis 1v Right  09/16/2015  CLINICAL DATA:  Right hip hemiarthroplasty EXAM: DG HIP (WITH OR WITHOUT PELVIS) 1V PORT RIGHT COMPARISON:  09/15/2015 right hip radiographs. FINDINGS: Status post interval right hemiarthroplasty with well-positioned right proximal femoral prosthesis. No right hip dislocation. No new osseous fracture. No suspicious focal osseous lesion. Expected soft tissue gas lateral to the right hip. Skin staples lateral to the right hip. IMPRESSION: Satisfactory postoperative appearance status post right hip hemiarthroplasty. Electronically Signed   By: Delbert Phenix M.D.   On: 09/16/2015 10:46   Dg Hip Unilat  With Pelvis 2-3 Views Right  09/15/2015  CLINICAL DATA:  Pain in right hip after fall. EXAM: DG HIP (WITH OR WITHOUT PELVIS) 2-3V RIGHT COMPARISON:  None. FINDINGS: There is foreshortening of the right femoral neck with a step-off both laterally and medially concerning for subcapital fracture with impaction. No other acute  abnormalities. IMPRESSION: The findings are highly concerning for a subcapital fracture with impaction. Electronically Signed   By: Gerome Sam III M.D   On: 09/15/2015 15:26   ASSESSMENT AND PLAN:  Jillian Horn is a 82 y.o. female with a known history of Dementia, rheumatoid arthritis and anemia. The patient fell by accident in skilled nursing facility. She complained of right hip pain but denies any headache, dizziness, syncope or loss of consciousness.   1.Acute Right hip fracture status post mechanical fall POD#1 -Orthopedic consult appreciated -  pain control, hold DVT prophylaxis for surgery. - DVT prophylaxis and PT after surgery.  2. UTI. Urinalysis show UTI.  -UC ecoli -change to po keflex  3.Dementia. Continue home medications  Overall improving to rehab in am. D/w dter  Case discussed with Care Management/Social Worker. Management plans discussed with the patient, family and they are in agreement.  CODE STATUS: Full  DVT Prophylaxis: per ortho  TOTAL TIME TAKING CARE OF THIS PATIENT:25 minutes.  >50% time spent on counselling and coordination of care POSSIBLE D/C IN 2 DAYS, DEPENDING ON CLINICAL CONDITION.  Note: This dictation was prepared with Dragon dictation along with smaller phrase technology. Any  transcriptional errors that result from this process are unintentional.  Solangel Mcmanaway M.D on 09/17/2015 at 10:46 AM  Between 7am to 6pm - Pager - [phone]  After 6pm go to [email or web address] - password [email or web address] [facility]  [place] [facility]  Office  [phone]  CC: Primary care physician; Dale Cockrell Hill, MD

## 2015-09-17 NOTE — Anesthesia Postprocedure Evaluation (Signed)
Anesthesia Post Note  Patient: Jillian Horn  Procedure(s) Performed: Procedure(s) (LRB): ARTHROPLASTY BIPOLAR HIP (HEMIARTHROPLASTY) (Right)  Patient location during evaluation: Nursing Unit Anesthesia Type: Spinal Level of consciousness: patient remains intubated per anesthesia plan, awake and alert, oriented and patient cooperative Pain management: pain level controlled Vital Signs Assessment: post-procedure vital signs reviewed and stable Respiratory status: spontaneous breathing, nonlabored ventilation and respiratory function stable Cardiovascular status: blood pressure returned to baseline and stable Postop Assessment: no headache, no backache and no signs of nausea or vomiting Anesthetic complications: no    Last Vitals:  Filed Vitals:   09/17/15 0432 09/17/15 0757  BP: 146/69 139/72  Pulse: 77 81  Temp: 37.5 C 37.3 C  Resp: 18 16    Last Pain:  Filed Vitals:   09/17/15 0757  PainSc: Ulyses [place]

## 2015-09-17 NOTE — Progress Notes (Signed)
Discontinued IV fluids per conversation with ortho MD. Encouraging PO intake. Family at bedside, educated family for PO intake of fluids.

## 2015-09-17 NOTE — Evaluation (Signed)
Physical Therapy Evaluation Patient Details Name: Jillian Horn MRN: [id number] DOB: 1930-01-10 Today's Date: 09/17/2015   History of Present Illness  82 y/o female here with R hip fx needing hemiarthroplasty.  Clinical Impression  Pt is able to follow instructions and participate well, but does have some confusion and needs extra cuing for some acts. Pt has pain with nearly all R LE movement, but is able to do AAROM (and some AROM) exercises and though she was very hesitant with WBing on the R she was able to do some minimal ambulation with heavy cuing and UE use.    Follow Up Recommendations SNF    Equipment Recommendations       Recommendations for Other Services       Precautions / Restrictions Precautions Precautions: Fall;Posterior Hip Precaution Booklet Issued: Yes (comment) Restrictions Weight Bearing Restrictions: Yes RLE Weight Bearing: Weight bearing as tolerated      Mobility  Bed Mobility Overal bed mobility: Needs Assistance Bed Mobility: Supine to Sit     Supine to sit: Mod assist     General bed mobility comments: Pt shows good effort but clearly needs considerable assist to get to and initially maintain sitting balance  Transfers Overall transfer level: Needs assistance Equipment used: Rolling walker (2 wheeled) Transfers: Sit to/from Stand Sit to Stand: Min assist         General transfer comment: Pt needs a lot of cuing and set up (UEs, feet, general positioning) but was able to show good effort getting up needing only minimal assist.   Ambulation/Gait Ambulation/Gait assistance: Mod assist Ambulation Distance (Feet): 6 Feet Assistive device: Rolling walker (2 wheeled)       General Gait Details: Pt struggles with stance phase on R with very hesitant WBing, heavy reliance on UEs but overall able to maintain balance and take small, cautious steps.    Stairs            Wheelchair Mobility    Modified Rankin (Stroke Patients  Only)       Balance                                             Pertinent Vitals/Pain Pain Assessment:  (unable to rate, no pain at rest, (+) pain with all R LE mvt)    Home Living Family/patient expects to be discharged to:: Skilled nursing facility                      Prior Function Level of Independence: Independent with assistive device(s)         Comments: Pt moved to [facility] at the begining of the year     Hand Dominance        Extremity/Trunk Assessment   Upper Extremity Assessment: Generalized weakness;Overall WFL for tasks assessed           Lower Extremity Assessment: RLE deficits/detail (L LE grossly 4-/5) RLE Deficits / Details: Pt able to do some AAROM activites with the R LE, but is weak and pain limited with all acts.  hip grossly 3-/5, knee grossly 3+/5 ankle grossly 4-/5       Communication   Communication: No difficulties  Cognition Arousal/Alertness:  (baseline confusion) Behavior During Therapy: WFL for tasks assessed/performed Overall Cognitive Status: History of cognitive impairments - at baseline  General Comments      Exercises Total Joint Exercises Ankle Circles/Pumps: Strengthening;10 reps Quad Sets: Strengthening;10 reps Gluteal Sets: Strengthening;10 reps Short Arc Quad: 10 reps;AROM Heel Slides: 5 reps;AAROM Hip ABduction/ADduction: AAROM;5 reps      Assessment/Plan    PT Assessment Patient needs continued PT services  PT Diagnosis Difficulty walking;Generalized weakness   PT Problem List Decreased strength;Decreased range of motion;Decreased activity tolerance;Decreased balance;Decreased mobility;Decreased coordination;Decreased cognition;Decreased knowledge of use of DME;Decreased safety awareness  PT Treatment Interventions DME instruction;Gait training;Functional mobility training;Therapeutic exercise;Therapeutic activities;Balance training;Neuromuscular  re-education;Cognitive remediation;Patient/family education   PT Goals (Current goals can be found in the Care Plan section) Acute Rehab PT Goals Patient Stated Goal: get stronger PT Goal Formulation: With patient/family Time For Goal Achievement: 10/01/15 Potential to Achieve Goals: Fair    Frequency BID   Barriers to discharge        Co-evaluation               End of Session Equipment Utilized During Treatment: Gait belt Activity Tolerance: Patient tolerated treatment well (limited by confusion) Patient left: with chair alarm set;with call bell/phone within reach           Time: 0916-0945 PT Time Calculation (min) (ACUTE ONLY): 29 min   Charges:   PT Evaluation $PT Eval Low Complexity: 1 Procedure PT Treatments $Therapeutic Exercise: 8-22 mins   PT G Codes:        Malachi Pro, DPT 09/17/2015, 1:02 PM

## 2015-09-18 ENCOUNTER — Encounter: Payer: Self-pay | Admitting: Surgery

## 2015-09-18 LAB — BASIC METABOLIC PANEL
Anion gap: 5 (ref 5–15)
BUN: 10 mg/dL (ref 6–20)
CALCIUM: 8.3 mg/dL — AB (ref 8.9–10.3)
CHLORIDE: 107 mmol/L (ref 101–111)
CO2: 25 mmol/L (ref 22–32)
CREATININE: 0.81 mg/dL (ref 0.44–1.00)
GFR calc non Af Amer: >60 mL/min (ref >60)
Glucose, Bld: 97 mg/dL (ref 65–99)
Potassium: 3.9 mmol/L (ref 3.5–5.1)
Sodium: 137 mmol/L (ref 135–145)

## 2015-09-18 LAB — HEMOGLOBIN: Hemoglobin: 10.2 g/dL — ABNORMAL LOW (ref 12.0–16.0)

## 2015-09-18 MED ORDER — OXYCODONE HCL 5 MG PO TABS: 5.0000 mg | ORAL_TABLET | ORAL | Status: DC | PRN

## 2015-09-18 MED ORDER — ENOXAPARIN SODIUM 40 MG/0.4ML ~~LOC~~ SOLN: 40.0000 mg | SUBCUTANEOUS | Status: DC

## 2015-09-18 MED ORDER — CEPHALEXIN 500 MG PO CAPS: 500.0000 mg | ORAL_CAPSULE | Freq: Two times a day (BID) | ORAL | Status: DC

## 2015-09-18 NOTE — Clinical Social Work Placement (Signed)
   CLINICAL SOCIAL WORK PLACEMENT  NOTE  Date:  09/18/2015  Patient Details  Name: Jillian Horn MRN: [id number] Date of Birth: May 06, 1929  Clinical Social Work is seeking post-discharge placement for this patient at the Skilled  Nursing Facility level of care (*CSW will initial, date and re-position this form in  chart as items are completed):  Yes   Patient/family provided with [facility] Clinical Social Work Department's list of facilities offering this level of care within the geographic area requested by the patient (or if unable, by the patient's family).  Yes   Patient/family informed of their freedom to choose among providers that offer the needed level of care, that participate in Medicare, Medicaid or managed care program needed by the patient, have an available bed and are willing to accept the patient.  Yes   Patient/family informed of [facility]'s ownership interest in [facility], as well as of the fact that they are under no obligation to receive care at these facilities.  PASRR submitted to EDS on       PASRR number received on       Existing PASRR number confirmed on 09/17/15     FL2 transmitted to all facilities in geographic area requested by pt/family on 09/17/15     FL2 transmitted to all facilities within larger geographic area on       Patient informed that his/her managed care company has contracts with or will negotiate with certain facilities, including the following:        Yes   Patient/family informed of bed offers received.  Patient chooses bed at  [facility] )     Physician recommends and patient chooses bed at      Patient to be transferred to  [facility] ) on 09/18/15.  Patient to be transferred to facility by  [facility] )     Patient family notified on 09/18/15 of transfer.  Name of family member notified:   (Patient's son and daughter in law are aware of D/C today. )      PHYSICIAN       Additional Comment:    _______________________________________________ Haig Prophet, LCSW 09/18/2015, 1:59 PM

## 2015-09-18 NOTE — Discharge Instructions (Signed)
HIP SURGERY POSTOPERATIVE DIRECTIONS  Hip Rehabilitation, Guidelines Following Surgery  The results of a hip operation are greatly improved after range of motion and muscle strengthening exercises. Follow all safety measures which are given to protect your hip. If any of these exercises cause increased pain or swelling in your joint, decrease the amount until you are comfortable again. Then slowly increase the exercises. Call your caregiver if you have problems or questions.   HOME CARE INSTRUCTIONS  Remove items at home which could result in a fall. This includes throw rugs or furniture in walking pathways.   ICE to the affected hip every three hours for 30 minutes at a time and then as needed for pain and swelling.  Continue to use ice on the hip for pain and swelling from surgery. You may notice swelling that will progress down to the foot and ankle.  This is normal after surgery.  Elevate the leg when you are not up walking on it.    Continue to use the breathing machine which will help keep your temperature down.  It is common for your temperature to cycle up and down following surgery, especially at night when you are not up moving around and exerting yourself.  The breathing machine keeps your lungs expanded and your temperature down.  DIET You may resume your previous home diet once your are discharged from the hospital.  DRESSING / WOUND CARE / SHOWERING Remove staples and apply steri strips on 09/30/15. Keep incision site clean and dry until 10/02/15. Follow up with [facility] in 6 weeks.    ACTIVITY Walk with your walker as instructed. Use walker as long as suggested by your caregivers. Avoid periods of inactivity such as sitting longer than an hour when not asleep. This helps prevent blood clots.  You may resume a sexual relationship in one month or when given the OK by your doctor.  You may return to work once you are cleared by your doctor.  Do not drive a car for 6 weeks or  until released by you surgeon.  Do not drive while taking narcotics.  WEIGHT BEARING Weight bearing as tolerated  POSTOPERATIVE CONSTIPATION PROTOCOL Constipation - defined medically as fewer than three stools per week and severe constipation as less than one stool per week.  One of the most common issues patients have following surgery is constipation.  Even if you have a regular bowel pattern at home, your normal regimen is likely to be disrupted due to multiple reasons following surgery.  Combination of anesthesia, postoperative narcotics, change in appetite and fluid intake all can affect your bowels.  In order to avoid complications following surgery, here are some recommendations in order to help you during your recovery period.  Colace (docusate) - Pick up an over-the-counter form of Colace or another stool softener and take twice a day as long as you are requiring postoperative pain medications.  Take with a full glass of water daily.  If you experience loose stools or diarrhea, hold the colace until you stool forms back up.  If your symptoms do not get better within 1 week or if they get worse, check with your doctor.  Dulcolax (bisacodyl) - Pick up over-the-counter and take as directed by the product packaging as needed to assist with the movement of your bowels.  Take with a full glass of water.  Use this product as needed if not relieved by Colace only.   MiraLax (polyethylene glycol) - Pick up over-the-counter  to have on hand.  MiraLax is a solution that will increase the amount of water in your bowels to assist with bowel movements.  Take as directed and can mix with a glass of water, juice, soda, coffee, or tea.  Take if you go more than two days without a movement. Do not use MiraLax more than once per day. Call your doctor if you are still constipated or irregular after using this medication for 7 days in a row.  If you continue to have problems with postoperative constipation,  please contact the office for further assistance and recommendations.  If you experience "the worst abdominal pain ever" or develop nausea or vomiting, please contact the office immediatly for further recommendations for treatment.  ITCHING  If you experience itching with your medications, try taking only a single pain pill, or even half a pain pill at a time.  You can also use Benadryl over the counter for itching or also to help with sleep.   TED HOSE STOCKINGS Wear the elastic stockings on both legs for six weeks following surgery during the day but you may remove then at night for sleeping.  MEDICATIONS See your medication summary on the After Visit Summary that the nursing staff will review with you prior to discharge.  You may have some home medications which will be placed on hold until you complete the course of blood thinner medication.  It is important for you to complete the blood thinner medication as prescribed by your surgeon.  Continue your approved medications as instructed at time of discharge.  PRECAUTIONS If you experience chest pain or shortness of breath - call 911 immediately for transfer to the hospital emergency department.  If you develop a fever greater that 101 F, purulent drainage from wound, increased redness or drainage from wound, foul odor from the wound/dressing, or calf pain - CONTACT YOUR SURGEON.                                                   FOLLOW-UP APPOINTMENTS Make sure you keep all of your appointments after your operation with your surgeon and caregivers. You should call the office at the above phone number and make an appointment for approximately two weeks after the date of your surgery or on the date instructed by your surgeon outlined in the "After Visit Summary".  RANGE OF MOTION AND STRENGTHENING EXERCISES  These exercises are designed to help you keep full movement of your hip joint. Follow your caregiver's or physical therapist's  instructions. Perform all exercises about fifteen times, three times per day or as directed. Exercise both hips, even if you have had only one joint replacement. These exercises can be done on a training (exercise) mat, on the floor, on a table or on a bed. Use whatever works the best and is most comfortable for you. Use music or television while you are exercising so that the exercises are a pleasant break in your day. This will make your life better with the exercises acting as a break in routine you can look forward to.  Lying on your back, slowly slide your foot toward your buttocks, raising your knee up off the floor. Then slowly slide your foot back down until your leg is straight again.  Lying on your back spread your legs as far apart as you  can without causing discomfort.  Lying on your side, raise your upper leg and foot straight up from the floor as far as is comfortable. Slowly lower the leg and repeat.  Lying on your back, tighten up the muscle in the front of your thigh (quadriceps muscles). You can do this by keeping your leg straight and trying to raise your heel off the floor. This helps strengthen the largest muscle supporting your knee.  Lying on your back, tighten up the muscles of your buttocks both with the legs straight and with the knee bent at a comfortable angle while keeping your heel on the floor.      IF YOU ARE TRANSFERRED TO A SKILLED REHAB FACILITY If the patient is transferred to a skilled rehab facility following release from the hospital, a list of the current medications will be sent to the facility for the patient to continue.  When discharged from the skilled rehab facility, please have the facility set up the patient's Home Health Physical Therapy prior to being released. Also, the skilled facility will be responsible for providing the patient with their medications at time of release from the facility to include their pain medication, the muscle relaxants, and their  blood thinner medication. If the patient is still at the rehab facility at time of the two week follow up appointment, the skilled rehab facility will also need to assist the patient in arranging follow up appointment in our office and any transportation needs.  MAKE SURE YOU:  Understand these instructions.  Get help right away if you are not doing well or get worse.    Pick up stool softner and laxative for home use following surgery while on pain medications. Continue to use ice for pain and swelling after surgery. Do not use any lotions or creams on the incision until instructed by your surgeon.

## 2015-09-18 NOTE — Progress Notes (Signed)
Patient's daughter in law and son chose [facility]. Patient is medically stable for D/C to [facility] today. Per [place] admissions coordinator at [facility] patient will go to room 28-B. RN will call report and arrange EMS for transport. Clinical Child psychotherapist (CSW) sent D/C Summary, FL2 and D/C Packet to [place] via [place]. [facility] Medicare authorization has been received. Auth # [id number]. Patient is aware of above. Patient's son and daughter in law are at bedside and aware of above. Please reconsult if future social work needs arise. CSW signing off.   Jetta Lout, LCSW [phone]

## 2015-09-18 NOTE — Care Management Important Message (Signed)
Important Message  Patient Details  Name: Jillian Horn MRN: [id number] Date of Birth: May 24, 1929   Medicare Important Message Given:  Yes    Adonis Huguenin, RN 09/18/2015, 8:59 AM

## 2015-09-18 NOTE — Progress Notes (Addendum)
Patient discharged to [facility] . Called and gave report to [facility] LPN, No acute distress noted. EMS called to pick up patient. Dressing changed to right hip as ordered

## 2015-09-18 NOTE — Progress Notes (Signed)
Patient discharged to skilled nursing facility and EMS picked up patient. Patient is alert to self vital signs stable, no acute distress noted

## 2015-09-18 NOTE — Progress Notes (Addendum)
   Subjective: 2 Days Post-Op Procedure(s) (LRB): ARTHROPLASTY BIPOLAR HIP (HEMIARTHROPLASTY) (Right) Patient reports pain as mild.  Patient is well, and has had no acute complaints or problems. States she rested well last night. Denies any CP, SOB, ABD pain. We will continue therapy today.  Plan is to go Rehab after hospital stay.  Objective: Vital signs in last 24 hours: Temp:  [98.2 F (36.8 C)-99.1 F (37.3 C)] 98.2 F (36.8 C) (07/03 0739) Pulse Rate:  [74-81] 79 (07/03 0739) Resp:  [14-20] 16 (07/03 0739) BP: (124-143)/(55-76) 143/76 mmHg (07/03 0739) SpO2:  [92 %-96 %] 95 % (07/03 0739)  Intake/Output from previous day: 07/02 0701 - 07/03 0700 In: 480 [P.O.:480] Out: 0  Intake/Output this shift:     Recent Labs  09/15/15 1530 09/16/15 0516 09/17/15 0402 09/18/15 0414  HGB 11.7* 10.3* 9.9* 10.2*    Recent Labs  09/16/15 0516 09/17/15 0402  WBC 8.7 10.2  RBC 3.24* 3.20*  HCT 30.1* 29.7*  PLT 239 234    Recent Labs  09/17/15 0402 09/18/15 0414  NA 138 137  K 3.8 3.9  CL 109 107  CO2 25 25  BUN 9 10  CREATININE 0.83 0.81  GLUCOSE 117* 97  CALCIUM 8.0* 8.3*    Recent Labs  09/15/15 1530  INR 1.08    EXAM General - Patient is Alert, Appropriate and Oriented. No signs of confusion. Extremity - Neurovascular intact Sensation intact distally Intact pulses distally Dorsiflexion/Plantar flexion intact No cellulitis present  Leg lengths equal Dressing - dressing C/D/I and scant drainage minimal. Motor Function - intact, moving foot and toes well on exam.   Past Medical History  Diagnosis Date  . Rheumatoid arthritis(714.0)     seropositive, oligo-articular.  MTX, s/p plaquenil, cytopenia on sulfasalazine, s/p remicade  . Osteoporosis     s/p fosamax, s/p forteo, reclast  . Diverticulosis   . Anemia     erosion on EGD  . Hiatal hernia   . Hypercholesterolemia   . Dementia     Assessment/Plan:   2 Days Post-Op Procedure(s)  (LRB): ARTHROPLASTY BIPOLAR HIP (HEMIARTHROPLASTY) (Right) Principal Problem:   Hip fracture (HCC)  Estimated body mass index is 22.65 kg/(m^2) as calculated from the following:   Height as of this encounter: 5\' 4"  (1.626 m).   Weight as of this encounter: 59.875 kg (132 lb). Advance diet Up with therapy Discharge to SNF today pending Medical clearance and placement. Remove staples and apply steri strips 09/30/15 Follow up with [facility] orthopedics in 6 weeks. Ted hose BLE x 6 weeks, remove at night time Continue with physical therapy Lovenox 40mg  daily for 14 days  DVT Prophylaxis - Lovenox, Foot Pumps and TED hose Weight-Bearing as tolerated to right leg   [facility], PA-C [facility] 09/18/2015, 7:46 AM

## 2015-09-18 NOTE — Progress Notes (Signed)
Physical Therapy Treatment Patient Details Name: Jillian Horn MRN: [id number] DOB: 11/18/1929 Today's Date: 09/18/2015    History of Present Illness 82 y/o female here with R hip fx needing hemiarthroplasty.    PT Comments    Pt agreeable to PT; reports "medium pain" in right hip. Pt continues to require Mod A for bed mobility and Min A for transfers and ambulation with heavy cues for technique and adherence to posterior hip precautions. Pt/family educated in exercises once in chair; pt requires assist. Pt is to go to skilled nursing facility today for continued rehab to progress strength, endurance, bed mobility, transfers, and ambulation quality to improve functional mobility.   Follow Up Recommendations  SNF     Equipment Recommendations       Recommendations for Other Services       Precautions / Restrictions Precautions Precautions: Fall;Posterior Hip Restrictions Weight Bearing Restrictions: Yes RLE Weight Bearing: Weight bearing as tolerated    Mobility  Bed Mobility Overal bed mobility: Needs Assistance Bed Mobility: Supine to Sit     Supine to sit: Mod assist     General bed mobility comments: Assist for RLE and trunk; does follow commands and gives good effort to attempt to move RLE and to elevate trunk/shift weight once sitting  Transfers Overall transfer level: Needs assistance Equipment used: Rolling walker (2 wheeled) Transfers: Sit to/from Stand Sit to Stand: From elevated surface;Min assist         General transfer comment: Cues for hand placement several times; cues for upright posture  Ambulation/Gait Ambulation/Gait assistance: Min assist Ambulation Distance (Feet): 5 Feet Assistive device: Rolling walker (2 wheeled) Gait Pattern/deviations: Step-to pattern;Decreased weight shift to right;Antalgic;Trunk flexed (pivots LLE) Gait velocity: decreased Gait velocity interpretation: <1.8 ft/sec, indicative of risk for recurrent falls General  Gait Details: Requires cues for upright posture; difficutly shifting weight to RLE with inability to pick LLE up and clear floor. Pt pivots LLE instread. Cues to use UEs to assist with weight shift to RLE.   Stairs            Wheelchair Mobility    Modified Rankin (Stroke Patients Only)       Balance Overall balance assessment: Needs assistance Sitting-balance support: Feet supported;Bilateral upper extremity supported Sitting balance-Leahy Scale: Fair     Standing balance support: Bilateral upper extremity supported Standing balance-Leahy Scale: Fair                      Cognition Arousal/Alertness: Awake/alert Behavior During Therapy: WFL for tasks assessed/performed Overall Cognitive Status: History of cognitive impairments - at baseline       Memory: Decreased recall of precautions (need education and reminders with function of all 3)              Exercises Total Joint Exercises Ankle Circles/Pumps: AROM;Both;20 reps (long sit) Quad Sets: Strengthening;Both;15 reps (long sit) Gluteal Sets: Strengthening;Both;15 reps (long sit) Heel Slides: AAROM;Right;10 reps (long sit) Hip ABduction/ADduction: AAROM;Right;10 reps (long sit) Other Exercises Other Exercises: R hip ER; AAROM/PROM 10 reps Other Exercises: Family education on exercises and R positioning    General Comments        Pertinent Vitals/Pain Pain Assessment:  (Reports pain in R hip as "medium")    Home Living                      Prior Function            PT Goals (current  goals can now be found in the care plan section) Progress towards PT goals: Progressing toward goals    Frequency  BID    PT Plan Current plan remains appropriate    Co-evaluation             End of Session Equipment Utilized During Treatment: Gait belt Activity Tolerance: Patient tolerated treatment well Patient left: in chair;with call bell/phone within reach;with chair alarm set;with  family/visitor present     Time: 5784-6962 PT Time Calculation (min) (ACUTE ONLY): 1410 min  Charges:  $Gait Training: 8-22 mins $Therapeutic Exercise: 8-22 mins                    G Codes:      Kristeen Miss, PTA 09/18/2015, 10:50 AM

## 2015-09-18 NOTE — Discharge Summary (Signed)
[facility] - [place] at [facility]   PATIENT NAME: Jillian Horn    MR#:  [id number]  DATE OF BIRTH:  03-27-1929  DATE OF ADMISSION:  09/15/2015 ADMITTING PHYSICIAN: Shaune Pollack, MD  DATE OF DISCHARGE: 09/18/14  PRIMARY CARE PHYSICIAN: Dale Minatare, MD    ADMISSION DIAGNOSIS:  Hip fracture, right, closed, initial encounter (HCC) [S72.001A]  DISCHARGE DIAGNOSIS:  Hip fracture Right s/p surgery due to mechanical fall UTI  SECONDARY DIAGNOSIS:   Past Medical History  Diagnosis Date  . Rheumatoid arthritis(714.0)     seropositive, oligo-articular.  MTX, s/p plaquenil, cytopenia on sulfasalazine, s/p remicade  . Osteoporosis     s/p fosamax, s/p forteo, reclast  . Diverticulosis   . Anemia     erosion on EGD  . Hiatal hernia   . Hypercholesterolemia   . Dementia     HOSPITAL COURSE:  Jillian Horn is a 82 y.o. female with a known history of Dementia, rheumatoid arthritis and anemia. The patient fell by accident in skilled nursing facility. She complained of right hip pain but denies any headache, dizziness, syncope or loss of consciousness.   1.Acute Right hip fracture status post mechanical fall POD#2 -Orthopedic consult appreciated - pain control - DVT prophylaxis with lovenox x 14 days  and to rehab for PT  2. UTI. Urinalysis show UTI.  -UC ecoli -change to po keflex  3.Dementia. Continue home medications  D/w Dr Rosita Kea. Pt ok to go to rehab. Family agreeable  CONSULTS OBTAINED:  Treatment Team:  Christena Flake, MD  DRUG ALLERGIES:   Allergies  Allergen Reactions  . Codeine Sulfate Other (See Comments)    Reaction:  Unknown   . Lidocaine Other (See Comments)    Reaction:  Unknown   . Sulfa Antibiotics Other (See Comments)    Reaction:  Unknown   . Sulfasalazine Other (See Comments)    Reaction:  Unknown     DISCHARGE MEDICATIONS:   Current Discharge Medication List    START taking these medications   Details   cephALEXin (KEFLEX) 500 MG capsule Take 1 capsule (500 mg total) by mouth every 12 (twelve) hours. Qty: 12 capsule, Refills: 0    enoxaparin (LOVENOX) 40 MG/0.4ML injection Inject 0.4 mLs (40 mg total) into the skin daily. X 14 days Qty: 14 Syringe, Refills: 0    oxyCODONE (OXY IR/ROXICODONE) 5 MG immediate release tablet Take 1 tablet (5 mg total) by mouth every 4 (four) hours as needed for breakthrough pain. Qty: 30 tablet, Refills: 0      CONTINUE these medications which have NOT CHANGED   Details  acetaminophen (TYLENOL) 500 MG tablet Take 500 mg by mouth 2 (two) times daily.    amLODipine (NORVASC) 2.5 MG tablet Take 2.5 mg by mouth daily.    cyanocobalamin 500 MCG tablet Take 500 mcg by mouth daily.    donepezil (ARICEPT) 10 MG tablet Take 10 mg by mouth daily.    escitalopram (LEXAPRO) 20 MG tablet Take 20 mg by mouth daily.    ferrous sulfate 325 (65 FE) MG tablet Take 325 mg by mouth daily with breakfast.    LORazepam (ATIVAN) 0.5 MG tablet Take 0.5 mg by mouth every 8 (eight) hours as needed (for agitation).    memantine (NAMENDA) 10 MG tablet Take 10 mg by mouth 2 (two) times daily.        If you experience worsening of your admission symptoms, develop shortness of breath, life threatening emergency, suicidal or homicidal  thoughts you must seek medical attention immediately by calling 911 or calling your MD immediately  if symptoms less severe.  You Must read complete instructions/literature along with all the possible adverse reactions/side effects for all the Medicines you take and that have been prescribed to you. Take any new Medicines after you have completely understood and accept all the possible adverse reactions/side effects.   Please note  You were cared for by a hospitalist during your hospital stay. If you have any questions about your discharge medications or the care you received while you were in the hospital after you are discharged, you can call the  unit and asked to speak with the hospitalist on call if the hospitalist that took care of you is not available. Once you are discharged, your primary care physician will handle any further medical issues. Please note that NO REFILLS for any discharge medications will be authorized once you are discharged, as it is imperative that you return to your primary care physician (or establish a relationship with a primary care physician if you do not have one) for your aftercare needs so that they can reassess your need for medications and monitor your lab values. Today   SUBJECTIVE   No complaints  VITAL SIGNS:  Blood pressure 143/76, pulse 79, temperature 98.2 F (36.8 C), temperature source Oral, resp. rate 16, height 5\' 4"  (1.626 m), weight 59.875 kg (132 lb), SpO2 95 %.  I/O:   Intake/Output Summary (Last 24 hours) at 09/18/15 0758 Last data filed at 09/17/15 1841  Gross per 24 hour  Intake    480 ml  Output      0 ml  Net    480 ml    PHYSICAL EXAMINATION:  GENERAL:  82 y.o.-year-old patient lying in the bed with no acute distress.  EYES: Pupils equal, round, reactive to light and accommodation. No scleral icterus. Extraocular muscles intact.  HEENT: Head atraumatic, normocephalic. Oropharynx and nasopharynx clear.  NECK:  Supple, no jugular venous distention. No thyroid enlargement, no tenderness.  LUNGS: Normal breath sounds bilaterally, no wheezing, rales,rhonchi or crepitation. No use of accessory muscles of respiration.  CARDIOVASCULAR: S1, S2 normal. No murmurs, rubs, or gallops.  ABDOMEN: Soft, non-tender, non-distended. Bowel sounds present. No organomegaly or mass.  EXTREMITIES: No pedal edema, cyanosis, or clubbing.  NEUROLOGIC: Cranial nerves II through XII are intact. Muscle strength 5/5 in all extremities. Sensation intact. Gait not checked.  PSYCHIATRIC: The patient is alert and oriented x 3.  SKIN: No obvious rash, lesion, or ulcer.   DATA REVIEW:   CBC   Recent  Labs Lab 09/17/15 0402 09/18/15 0414  WBC 10.2  --   HGB 9.9* 10.2*  HCT 29.7*  --   PLT 234  --     Chemistries   Recent Labs Lab 09/15/15 1530  09/18/15 0414  NA 142  < > 137  K 3.9  < > 3.9  CL 106  < > 107  CO2 27  < > 25  GLUCOSE 107*  < > 97  BUN 17  < > 10  CREATININE 0.94  < > 0.81  CALCIUM 8.9  < > 8.3*  AST 15  --   --   ALT 12*  --   --   ALKPHOS 61  --   --   BILITOT 0.5  --   --   < > = values in this interval not displayed.  Microbiology Results   Recent Results (from the past  240 hour(s))  Urine culture     Status: Abnormal   Collection Time: 09/15/15  4:39 PM  Result Value Ref Range Status   Specimen Description URINE, CLEAN CATCH  Final   Special Requests Normal  Final   Culture >=100,000 COLONIES/mL ESCHERICHIA COLI (A)  Final   Report Status 09/17/2015 FINAL  Final   Organism ID, Bacteria ESCHERICHIA COLI (A)  Final      Susceptibility   Escherichia coli - MIC*    AMPICILLIN <=2 SENSITIVE Sensitive     CEFAZOLIN <=4 SENSITIVE Sensitive     CEFTRIAXONE <=1 SENSITIVE Sensitive     CIPROFLOXACIN <=0.25 SENSITIVE Sensitive     GENTAMICIN <=1 SENSITIVE Sensitive     IMIPENEM <=0.25 SENSITIVE Sensitive     NITROFURANTOIN <=16 SENSITIVE Sensitive     TRIMETH/SULFA <=20 SENSITIVE Sensitive     AMPICILLIN/SULBACTAM <=2 SENSITIVE Sensitive     PIP/TAZO <=4 SENSITIVE Sensitive     Extended ESBL NEGATIVE Sensitive     * >=100,000 COLONIES/mL ESCHERICHIA COLI  MRSA PCR Screening     Status: None   Collection Time: 09/15/15  5:29 PM  Result Value Ref Range Status   MRSA by PCR NEGATIVE NEGATIVE Final    Comment:        The GeneXpert MRSA Assay (FDA approved for NASAL specimens only), is one component of a comprehensive MRSA colonization surveillance program. It is not intended to diagnose MRSA infection nor to guide or monitor treatment for MRSA infections.     RADIOLOGY:  Dg Hip Port Unilat With Pelvis 1v Right  09/16/2015  CLINICAL DATA:   Right hip hemiarthroplasty EXAM: DG HIP (WITH OR WITHOUT PELVIS) 1V PORT RIGHT COMPARISON:  09/15/2015 right hip radiographs. FINDINGS: Status post interval right hemiarthroplasty with well-positioned right proximal femoral prosthesis. No right hip dislocation. No new osseous fracture. No suspicious focal osseous lesion. Expected soft tissue gas lateral to the right hip. Skin staples lateral to the right hip. IMPRESSION: Satisfactory postoperative appearance status post right hip hemiarthroplasty. Electronically Signed   By: Delbert Phenix M.D.   On: 09/16/2015 10:46     Management plans discussed with the patient, family and they are in agreement.  CODE STATUS:     Code Status Orders        Start     Ordered   09/15/15 1703  Full code   Continuous     09/15/15 1702    Code Status History    Date Active Date Inactive Code Status Order ID Comments User Context   This patient has a current code status but no historical code status.      TOTAL TIME TAKING CARE OF THIS PATIENT: 40 minutes.    Bowden Boody M.D on 09/18/2015 at 7:58 AM  Between 7am to 6pm - Pager - [phone] After 6pm go to [email or web address] - password [email or web address] [facility]  [place]  [facility]  Office  [phone]  CC: Primary care physician; Dale Britton, MD

## 2015-09-21 LAB — SURGICAL PATHOLOGY

## 2015-10-13 ENCOUNTER — Telehealth: Payer: Self-pay | Admitting: *Deleted

## 2015-10-13 NOTE — Telephone Encounter (Signed)
Please advise, thanks.

## 2015-10-13 NOTE — Telephone Encounter (Signed)
[facility] requested verbal orders for physical therapy 3 times a week for 2 weeks  Contact [facility] [phone]

## 2015-10-13 NOTE — Telephone Encounter (Signed)
Reviewed chart.  I have not seen her since 10/2014.  Per phone note, was previously admitted to [facility] and apparently being followed by MD there because I have not received anything or seen her since.  Need to confirm who is seeing her now and get them to ok orders.

## 2015-10-13 NOTE — Telephone Encounter (Signed)
Spoke with Jillian Horn, gave him the message and he will contact [employer], thanks

## 2015-12-25 ENCOUNTER — Emergency Department: Payer: Medicare Other

## 2015-12-25 ENCOUNTER — Emergency Department
Admission: EM | Admit: 2015-12-25 | Discharge: 2015-12-25 | Disposition: A | Discharge status: Home / Self Care | Payer: Medicare Other | Attending: Emergency Medicine | Admitting: Emergency Medicine

## 2015-12-25 DIAGNOSIS — Z79899 Other long term (current) drug therapy: Secondary | ICD-10-CM | POA: Insufficient documentation

## 2015-12-25 DIAGNOSIS — R4182 Altered mental status, unspecified: Secondary | ICD-10-CM | POA: Diagnosis present

## 2015-12-25 DIAGNOSIS — Z7901 Long term (current) use of anticoagulants: Secondary | ICD-10-CM | POA: Diagnosis not present

## 2015-12-25 DIAGNOSIS — Z85828 Personal history of other malignant neoplasm of skin: Secondary | ICD-10-CM | POA: Diagnosis not present

## 2015-12-25 DIAGNOSIS — Z791 Long term (current) use of non-steroidal anti-inflammatories (NSAID): Secondary | ICD-10-CM | POA: Diagnosis not present

## 2015-12-25 DIAGNOSIS — F919 Conduct disorder, unspecified: Secondary | ICD-10-CM | POA: Insufficient documentation

## 2015-12-25 DIAGNOSIS — R4689 Other symptoms and signs involving appearance and behavior: Secondary | ICD-10-CM

## 2015-12-25 LAB — COMPREHENSIVE METABOLIC PANEL
ALBUMIN: 3.1 g/dL — AB (ref 3.5–5.0)
ALK PHOS: 43 U/L (ref 38–126)
ALT: 7 U/L — AB (ref 14–54)
AST: 15 U/L (ref 15–41)
Anion gap: 7 (ref 5–15)
BUN: 17 mg/dL (ref 6–20)
CALCIUM: 9.3 mg/dL (ref 8.9–10.3)
CO2: 26 mmol/L (ref 22–32)
CREATININE: 1.01 mg/dL — AB (ref 0.44–1.00)
Chloride: 105 mmol/L (ref 101–111)
GFR calc Af Amer: 58 mL/min — ABNORMAL LOW (ref >60)
GFR calc non Af Amer: 50 mL/min — ABNORMAL LOW (ref >60)
GLUCOSE: 85 mg/dL (ref 65–99)
Potassium: 4.2 mmol/L (ref 3.5–5.1)
SODIUM: 138 mmol/L (ref 135–145)
Total Bilirubin: 0.4 mg/dL (ref 0.3–1.2)
Total Protein: 7.7 g/dL (ref 6.5–8.1)

## 2015-12-25 LAB — URINALYSIS COMPLETE WITH MICROSCOPIC (ARMC ONLY)
Bacteria, UA: NONE SEEN
Bilirubin Urine: NEGATIVE
GLUCOSE, UA: NEGATIVE mg/dL
Ketones, ur: NEGATIVE mg/dL
Nitrite: NEGATIVE
Protein, ur: NEGATIVE mg/dL
SPECIFIC GRAVITY, URINE: 1.006 (ref 1.005–1.030)
pH: 7 (ref 5.0–8.0)

## 2015-12-25 LAB — TROPONIN I

## 2015-12-25 LAB — CBC
HCT: 36.2 % (ref 35.0–47.0)
HEMOGLOBIN: 12.2 g/dL (ref 12.0–16.0)
MCH: 31 pg (ref 26.0–34.0)
MCHC: 33.6 g/dL (ref 32.0–36.0)
MCV: 92.2 fL (ref 80.0–100.0)
PLATELETS: 302 10*3/uL (ref 150–440)
RBC: 3.93 MIL/uL (ref 3.80–5.20)
RDW: 14.8 % — ABNORMAL HIGH (ref 11.5–14.5)
WBC: 5.6 10*3/uL (ref 3.6–11.0)

## 2015-12-25 NOTE — ED Notes (Signed)
Pts son at bedside, sts that pt is at baseline

## 2015-12-25 NOTE — ED Notes (Signed)
Fall alarm placed on pt 

## 2015-12-25 NOTE — ED Provider Notes (Signed)
[facility] Emergency Department Provider Note    ____________________________________________   I have reviewed the triage vital signs and the nursing notes.   HISTORY  Chief Complaint Altered Mental Status   History limited by: Dementia   HPI Jillian Horn is a 82 y.o. female who presents to ER from the [facility] via EMS because of concern for altered mental status and abnormal behavior. Per report the patient has just finished a course of keflex for a UTI today. She then started acting abnormally. She apparently was tearing up her room. The patient herself does not recall doing anything abnormal today. She states she feels like she had a great day and does not understand why she is here. She denies any pain.   Past Medical History:  Diagnosis Date  . Anemia    erosion on EGD  . Dementia   . Diverticulosis   . Hiatal hernia   . Hypercholesterolemia   . Osteoporosis    s/p fosamax, s/p forteo, reclast  . Rheumatoid arthritis(714.0)    seropositive, oligo-articular.  MTX, s/p plaquenil, cytopenia on sulfasalazine, s/p remicade    Patient Active Problem List   Diagnosis Date Noted  . Hip fracture (HCC) 09/15/2015  . Noncompliance with medications 11/08/2014  . Health care maintenance 05/08/2014  . Rheumatoid arthritis (HCC) 08/10/2012  . Anemia 08/10/2012  . Osteoporosis 08/10/2012  . Hypercholesterolemia 08/10/2012  . Dementia 08/10/2012  . Hiatal hernia 08/10/2012    Past Surgical History:  Procedure Laterality Date  . ABDOMINAL HYSTERECTOMY     partial  . APPENDECTOMY    . BREAST BIOPSY    . HIP ARTHROPLASTY Right 09/16/2015   Procedure: ARTHROPLASTY BIPOLAR HIP (HEMIARTHROPLASTY);  Surgeon: Christena Flake, MD;  Location: [facility] ORS;  Service: Orthopedics;  Laterality: Right;  . KYPHOPLASTY     thoracic spine  . SKIN CANCER EXCISION      Prior to Admission medications   Medication Sig Start Date End Date Taking? Authorizing Provider   acetaminophen (TYLENOL) 500 MG tablet Take 500 mg by mouth 2 (two) times daily.    Historical Provider, MD  amLODipine (NORVASC) 2.5 MG tablet Take 2.5 mg by mouth daily.    Historical Provider, MD  cephALEXin (KEFLEX) 500 MG capsule Take 1 capsule (500 mg total) by mouth every 12 (twelve) hours. 09/18/15   Enedina Finner, MD  cyanocobalamin 500 MCG tablet Take 500 mcg by mouth daily.    Historical Provider, MD  donepezil (ARICEPT) 10 MG tablet Take 10 mg by mouth daily.    Historical Provider, MD  enoxaparin (LOVENOX) 40 MG/0.4ML injection Inject 0.4 mLs (40 mg total) into the skin daily. X 14 days 09/18/15 10/02/15  Evon Slack, PA-C  escitalopram (LEXAPRO) 20 MG tablet Take 20 mg by mouth daily.    Historical Provider, MD  ferrous sulfate 325 (65 FE) MG tablet Take 325 mg by mouth daily with breakfast.    Historical Provider, MD  LORazepam (ATIVAN) 0.5 MG tablet Take 0.5 mg by mouth every 8 (eight) hours as needed (for agitation).    Historical Provider, MD  memantine (NAMENDA) 10 MG tablet Take 10 mg by mouth 2 (two) times daily.    Historical Provider, MD  oxyCODONE (OXY IR/ROXICODONE) 5 MG immediate release tablet Take 1 tablet (5 mg total) by mouth every 4 (four) hours as needed for breakthrough pain. 09/18/15   Enedina Finner, MD    Allergies Codeine sulfate; Lidocaine; Sulfa antibiotics; and Sulfasalazine  Family History  Problem Relation Age of Onset  . Heart attack Mother   . Stroke Father   . Cancer Sister     Social History Social History  Substance Use Topics  . Smoking status: Never Smoker  . Smokeless tobacco: Never Used  . Alcohol use No    Review of Systems  Constitutional: Negative for fever. Cardiovascular: Negative for chest pain. Respiratory: Negative for shortness of breath. Gastrointestinal: Negative for abdominal pain, vomiting and diarrhea. Genitourinary: Negative for dysuria. Musculoskeletal: Negative for back pain. Skin: Negative for rash. Neurological:  Negative for headaches, focal weakness or numbness.  10-point ROS otherwise negative.  ____________________________________________   PHYSICAL EXAM:  VITAL SIGNS: ED Triage Vitals  Enc Vitals Group     BP 12/25/15 2042 136/84     Pulse Rate 12/25/15 2042 72     Resp 12/25/15 2042 20     Temp 12/25/15 2042 98.1 F (36.7 C)     Temp Source 12/25/15 2042 Oral     SpO2 12/25/15 2042 100 %     Weight 12/25/15 2043 130 lb (59 kg)     Height 12/25/15 2043 5\' 5"  (1.651 m)     Head Circumference --      Peak Flow --      Pain Score 12/25/15 2043 0     Pain Loc --      Pain Edu? --      Excl. in GC? --    Constitutional: Awake and alert. Not oriented to events.  Eyes: Conjunctivae are normal. Normal extraocular movements. ENT   Head: Normocephalic and atraumatic.   Nose: No congestion/rhinnorhea.   Mouth/Throat: Mucous membranes are moist.   Neck: No stridor. Hematological/Lymphatic/Immunilogical: No cervical lymphadenopathy. Cardiovascular: Normal rate, regular rhythm.  No murmurs, rubs, or gallops. Respiratory: Normal respiratory effort without tachypnea nor retractions. Breath sounds are clear and equal bilaterally. No wheezes/rales/rhonchi. Gastrointestinal: Soft and nontender. No distention.  Genitourinary: Deferred Musculoskeletal: Normal range of motion in all extremities. No lower extremity edema. Neurologic:  No slurred speech. Not oriented to events. Oriented to place. No gross focal neurologic deficits are appreciated.  Skin:  Skin is warm, dry and intact. No rash noted.   ____________________________________________    LABS (pertinent positives/negatives)  Labs Reviewed  COMPREHENSIVE METABOLIC PANEL - Abnormal; Notable for the following:       Result Value   Creatinine, Ser 1.01 (*)    Albumin 3.1 (*)    ALT 7 (*)    GFR calc non Af Amer 50 (*)    GFR calc Af Amer 58 (*)    All other components within normal limits  CBC - Abnormal; Notable for  the following:    RDW 14.8 (*)    All other components within normal limits  URINALYSIS COMPLETEWITH MICROSCOPIC ([facility] ONLY) - Abnormal; Notable for the following:    Color, Urine STRAW (*)    APPearance CLEAR (*)    Hgb urine dipstick 1+ (*)    Leukocytes, UA TRACE (*)    Squamous Epithelial / LPF 0-5 (*)    All other components within normal limits  TROPONIN I  CBG MONITORING, ED    ____________________________________________   EKG  None  ____________________________________________    RADIOLOGY  CT head   IMPRESSION:  No acute abnormality.    Extensive atrophy and chronic microvascular ischemic change.       ____________________________________________   PROCEDURES  Procedures  ____________________________________________   INITIAL IMPRESSION / ASSESSMENT AND PLAN / ED COURSE  Pertinent labs & imaging results that were available during my care of the patient were reviewed by me and considered in my medical decision making (see chart for details).  Workup here without obvious or concerning etiology of the patient's symptoms.. Think patient might of been sundowning. According to son patient is worse in the evenings. Patient is comfortable with patient going back to living facility. ____________________________________________   FINAL CLINICAL IMPRESSION(S) / ED DIAGNOSES  Final diagnoses:  Abnormal behavior     Note: This dictation was prepared with Dragon dictation. Any transcriptional errors that result from this process are unintentional    Phineas Semen, MD 12/25/15 2308

## 2015-12-25 NOTE — ED Notes (Signed)
Patient transported to CT 

## 2015-12-25 NOTE — ED Notes (Signed)
Pt returned from CT °

## 2015-12-25 NOTE — ED Triage Notes (Signed)
Pt bib EMS from the [facility] w/ c/o AMS.  Per EMS, facility reported she "tore up the room".  Per EMS pt finished Keflex for UTI yesterday.  Son wanted pt checked out.

## 2015-12-25 NOTE — Discharge Instructions (Signed)
Please seek medical attention for any high fevers, chest pain, shortness of breath, change in behavior, persistent vomiting, bloody stool or any other new or concerning symptoms.  

## 2016-01-11 ENCOUNTER — Telehealth: Payer: Self-pay | Admitting: Internal Medicine

## 2016-01-11 NOTE — Telephone Encounter (Signed)
Jillian Horn from [facility] called looking for an update on orders that was faxed on 10/20. Home Health care of plan from July. Please advise, thank you!  Call [phone] ext [phone]

## 2016-01-12 NOTE — Telephone Encounter (Signed)
Left message to return call 

## 2016-01-12 NOTE — Telephone Encounter (Signed)
Jillian Horn from [facility] returned office call.

## 2016-01-12 NOTE — Telephone Encounter (Signed)
Notified kelsey that we have not seen patient since 10/2014. The order possibly came from a physician at [facility] house.

## 2017-02-10 ENCOUNTER — Emergency Department: Payer: Medicare Other

## 2017-02-10 ENCOUNTER — Emergency Department
Admission: EM | Admit: 2017-02-10 | Discharge: 2017-02-10 | Disposition: A | Discharge status: Home / Self Care | Payer: Medicare Other | Source: Home / Self Care | Attending: Emergency Medicine | Admitting: Emergency Medicine

## 2017-02-10 ENCOUNTER — Other Ambulatory Visit: Payer: Self-pay

## 2017-02-10 DIAGNOSIS — Z85828 Personal history of other malignant neoplasm of skin: Secondary | ICD-10-CM

## 2017-02-10 DIAGNOSIS — Z96641 Presence of right artificial hip joint: Secondary | ICD-10-CM

## 2017-02-10 DIAGNOSIS — N3000 Acute cystitis without hematuria: Secondary | ICD-10-CM | POA: Diagnosis not present

## 2017-02-10 DIAGNOSIS — F0390 Unspecified dementia without behavioral disturbance: Secondary | ICD-10-CM

## 2017-02-10 DIAGNOSIS — N390 Urinary tract infection, site not specified: Secondary | ICD-10-CM

## 2017-02-10 DIAGNOSIS — R410 Disorientation, unspecified: Secondary | ICD-10-CM | POA: Diagnosis not present

## 2017-02-10 LAB — URINALYSIS, COMPLETE (UACMP) WITH MICROSCOPIC
Bilirubin Urine: NEGATIVE
Glucose, UA: NEGATIVE mg/dL
Hgb urine dipstick: NEGATIVE
Ketones, ur: NEGATIVE mg/dL
Leukocytes, UA: NEGATIVE
Nitrite: POSITIVE — AB
Protein, ur: 100 mg/dL — AB
Specific Gravity, Urine: 1.025 (ref 1.005–1.030)
Squamous Epithelial / LPF: NONE SEEN
pH: 6 (ref 5.0–8.0)

## 2017-02-10 LAB — CBC
HCT: 39.5 % (ref 35.0–47.0)
HEMOGLOBIN: 13.2 g/dL (ref 12.0–16.0)
MCH: 33.1 pg (ref 26.0–34.0)
MCHC: 33.6 g/dL (ref 32.0–36.0)
MCV: 98.7 fL (ref 80.0–100.0)
Platelets: 239 10*3/uL (ref 150–440)
RBC: 4 MIL/uL (ref 3.80–5.20)
RDW: 14 % (ref 11.5–14.5)
WBC: 5.9 10*3/uL (ref 3.6–11.0)

## 2017-02-10 LAB — COMPREHENSIVE METABOLIC PANEL
ALBUMIN: 3.3 g/dL — AB (ref 3.5–5.0)
ALK PHOS: 52 U/L (ref 38–126)
ALT: 9 U/L — ABNORMAL LOW (ref 14–54)
AST: 16 U/L (ref 15–41)
Anion gap: 9 (ref 5–15)
BUN: 18 mg/dL (ref 6–20)
CO2: 24 mmol/L (ref 22–32)
Calcium: 9.6 mg/dL (ref 8.9–10.3)
Chloride: 107 mmol/L (ref 101–111)
Creatinine, Ser: 0.75 mg/dL (ref 0.44–1.00)
GLUCOSE: 90 mg/dL (ref 65–99)
Potassium: 4.1 mmol/L (ref 3.5–5.1)
Sodium: 140 mmol/L (ref 135–145)
Total Bilirubin: 0.6 mg/dL (ref 0.3–1.2)
Total Protein: 7.1 g/dL (ref 6.5–8.1)

## 2017-02-10 LAB — LIPASE, BLOOD: LIPASE: 38 U/L (ref 11–51)

## 2017-02-10 MED ORDER — IOPAMIDOL (ISOVUE-300) INJECTION 61%
75.0000 mL | Freq: Once | INTRAVENOUS | Status: AC | PRN
  Administered 2017-02-10: 75 mL via INTRAVENOUS

## 2017-02-10 MED ORDER — CEPHALEXIN 500 MG PO CAPS: 500.0000 mg | ORAL_CAPSULE | Freq: Two times a day (BID) | ORAL | 0 refills | Status: DC

## 2017-02-10 MED ORDER — CEFTRIAXONE SODIUM IN DEXTROSE 20 MG/ML IV SOLN
INTRAVENOUS | Status: DC
Start: 2017-02-10 — End: 2017-02-10
  Filled 2017-02-10: qty 50

## 2017-02-10 MED ORDER — CEFTRIAXONE SODIUM IN DEXTROSE 20 MG/ML IV SOLN
1.0000 g | Freq: Once | INTRAVENOUS | Status: AC
  Administered 2017-02-10: 1 g via INTRAVENOUS

## 2017-02-10 NOTE — ED Notes (Signed)
Facility to come pick up patient at approx 330PM.

## 2017-02-10 NOTE — ED Notes (Signed)
Pt in CT.

## 2017-02-10 NOTE — ED Notes (Signed)
Pt left with facility transportation [place]. Pt into van via wheelchair then stand.

## 2017-02-10 NOTE — ED Notes (Signed)
ED Provider at bedside. 

## 2017-02-10 NOTE — ED Provider Notes (Signed)
[facility] Emergency Department Provider Note   ____________________________________________    I have reviewed the triage vital signs and the nursing notes.   HISTORY  Chief Complaint Flank Pain     HPI Jillian Horn is a 83 y.o. female presents with complaints of reported flank pain.  Patient is a history of dementia but apparently was complaining of bilateral flank pain this morning.  Family reports she has a history of urinary tract infections and kidney infections.  No fevers reported.  No nausea or vomiting.  No reports of diarrhea.  Patient is not able to provide significant history.  She has not been given anything for her symptoms  Past Medical History:  Diagnosis Date  . Anemia    erosion on EGD  . Dementia   . Diverticulosis   . Hiatal hernia   . Hypercholesterolemia   . Osteoporosis    s/p fosamax, s/p forteo, reclast  . Rheumatoid arthritis(714.0)    seropositive, oligo-articular.  MTX, s/p plaquenil, cytopenia on sulfasalazine, s/p remicade    Patient Active Problem List   Diagnosis Date Noted  . Hip fracture (HCC) 09/15/2015  . Noncompliance with medications 11/08/2014  . Health care maintenance 05/08/2014  . Rheumatoid arthritis (HCC) 08/10/2012  . Anemia 08/10/2012  . Osteoporosis 08/10/2012  . Hypercholesterolemia 08/10/2012  . Dementia 08/10/2012  . Hiatal hernia 08/10/2012    Past Surgical History:  Procedure Laterality Date  . ABDOMINAL HYSTERECTOMY     partial  . APPENDECTOMY    . BREAST BIOPSY    . HIP ARTHROPLASTY Right 09/16/2015   Procedure: ARTHROPLASTY BIPOLAR HIP (HEMIARTHROPLASTY);  Surgeon: Christena Flake, MD;  Location: [facility] ORS;  Service: Orthopedics;  Laterality: Right;  . KYPHOPLASTY     thoracic spine  . SKIN CANCER EXCISION      Prior to Admission medications   Medication Sig Start Date End Date Taking? Authorizing Provider  barrier cream (NON-SPECIFIED) CREA Apply 1 application topically 2  (two) times daily as needed.   Yes [provider]  Lorazepam POWD Apply 1 mL topically daily as needed.   Yes [provider]  cephALEXin (KEFLEX) 500 MG capsule Take 1 capsule (500 mg total) by mouth 2 (two) times daily. 02/10/17   Jene Every, MD  enoxaparin (LOVENOX) 40 MG/0.4ML injection Inject 0.4 mLs (40 mg total) into the skin daily. X 14 days 09/18/15 10/02/15  Evon Slack, PA-C  oxyCODONE (OXY IR/ROXICODONE) 5 MG immediate release tablet Take 1 tablet (5 mg total) by mouth every 4 (four) hours as needed for breakthrough pain. Patient not taking: Reported on 02/10/2017 09/18/15   Enedina Finner, MD     Allergies Codeine sulfate; Lidocaine; Sulfa antibiotics; and Sulfasalazine  Family History  Problem Relation Age of Onset  . Heart attack Mother   . Stroke Father   . Cancer Sister     Social History Social History   Tobacco Use  . Smoking status: Never Smoker  . Smokeless tobacco: Never Used  Substance Use Topics  . Alcohol use: No    Alcohol/week: 0.0 oz  . Drug use: No    Review of Systems  Constitutional: No fever/chills Eyes: No visual changes.  ENT: No sore throat. Cardiovascular: Denies chest pain. Respiratory: Denies shortness of breath. Gastrointestinal: No abdominal pain.   Genitourinary: Negative for dysuria. Musculoskeletal: Positive for bilateral back pain Skin: Negative for rash. Neurological: Negative for headaches    ____________________________________________   PHYSICAL EXAM:  VITAL SIGNS:  ED Triage Vitals  Enc Vitals Group     BP 02/10/17 0941 112/90     Pulse Rate 02/10/17 0941 74     Resp 02/10/17 0941 18     Temp 02/10/17 0941 98.1 F (36.7 C)     Temp Source 02/10/17 0941 Oral     SpO2 02/10/17 0941 98 %     Weight 02/10/17 0942 55.8 kg (123 lb 0.3 oz)     Height 02/10/17 0942 1.575 m (5\' 2" )     Head Circumference --      Peak Flow --      Pain Score --      Pain Loc --      Pain Edu? --      Excl. in  GC? --     Constitutional: Alert No acute distress. Pleasant and interactive Eyes: Conjunctivae are normal.   Nose: No congestion/rhinnorhea. Mouth/Throat: Mucous membranes are moist.   Neck:  Painless ROM Cardiovascular: Normal rate, regular rhythm. Grossly normal heart sounds.  Good peripheral circulation. Respiratory: Normal respiratory effort.  No retractions. Lungs CTAB. Gastrointestinal: Soft and nontender. No distention.  No significant CVA tenderness Genitourinary: deferred Musculoskeletal: Back: No vertebral tenderness to palpation, no muscle spasm erythema or swelling.  Warm and well perfused extremities Neurologic:  Normal speech and language. No gross focal neurologic deficits are appreciated.  Skin:  Skin is warm, dry and intact. No rash noted. Psychiatric: Mood and affect are normal. Speech and behavior are normal.  ____________________________________________   LABS (all labs ordered are listed, but only abnormal results are displayed)  Labs Reviewed  COMPREHENSIVE METABOLIC PANEL - Abnormal; Notable for the following components:      Result Value   Albumin 3.3 (*)    ALT 9 (*)    All other components within normal limits  URINALYSIS, COMPLETE (UACMP) WITH MICROSCOPIC - Abnormal; Notable for the following components:   Color, Urine YELLOW (*)    APPearance CLEAR (*)    Protein, ur 100 (*)    Nitrite POSITIVE (*)    Bacteria, UA MANY (*)    All other components within normal limits  URINE CULTURE  CBC  LIPASE, BLOOD   ____________________________________________  EKG  None ____________________________________________  RADIOLOGY  CT abdomen pelvis no acute distress ____________________________________________   PROCEDURES  Procedure(s) performed: No  Procedures   Critical Care performed: No ____________________________________________   INITIAL IMPRESSION / ASSESSMENT AND PLAN / ED COURSE  Pertinent labs & imaging results that were  available during my care of the patient were reviewed by me and considered in my medical decision making (see chart for details).  Patient overall well-appearing in no acute distress.  Vital signs normal.  Lab work is unremarkable, urinalysis with bacteria and nitrite positive however no white blood cells or leukocytes.  Unclear if this is the cause of her pain.  We will obtain CT imaging  CT scan overall reassuring, chronic hiatal hernia.  Multiple chronic compression fractures this may also be a cause of her back pain.  On reexam patient is well-appearing in no acute distress.  We will give a dose of IV Rocephin and discharged back to facility with p.o. Keflex    ____________________________________________   FINAL CLINICAL IMPRESSION(S) / ED DIAGNOSES  Final diagnoses:  Lower urinary tract infection        Note:  This document was prepared using Dragon voice recognition software and may include unintentional dictation errors.      , MD 02/10/17  1407  

## 2017-02-10 NOTE — ED Notes (Signed)
Pt alert and oriented X4, active, cooperative, pt in NAD. RR even and unlabored, color WNL.  Facility given report. All of paperwork and belongings that came with patient sent back with patient.

## 2017-02-10 NOTE — ED Triage Notes (Signed)
Patient from the [facility] with bilateral flank pain noted by staff this am. Patient continues to state the same. Patient with history of dementia is disoriented which is normal per son.

## 2017-02-12 ENCOUNTER — Inpatient Hospital Stay
Admission: EM | Admit: 2017-02-12 | Discharge: 2017-02-14 | DRG: 689 | Disposition: A | Discharge status: Hospice | Payer: Medicare Other | Attending: Internal Medicine | Admitting: Internal Medicine

## 2017-02-12 ENCOUNTER — Other Ambulatory Visit: Payer: Self-pay

## 2017-02-12 ENCOUNTER — Encounter: Payer: Self-pay | Admitting: Emergency Medicine

## 2017-02-12 ENCOUNTER — Emergency Department: Payer: Medicare Other

## 2017-02-12 DIAGNOSIS — Z882 Allergy status to sulfonamides status: Secondary | ICD-10-CM | POA: Diagnosis not present

## 2017-02-12 DIAGNOSIS — Z1612 Extended spectrum beta lactamase (ESBL) resistance: Secondary | ICD-10-CM | POA: Diagnosis present

## 2017-02-12 DIAGNOSIS — M549 Dorsalgia, unspecified: Secondary | ICD-10-CM | POA: Diagnosis present

## 2017-02-12 DIAGNOSIS — E785 Hyperlipidemia, unspecified: Secondary | ICD-10-CM | POA: Diagnosis present

## 2017-02-12 DIAGNOSIS — N3000 Acute cystitis without hematuria: Principal | ICD-10-CM | POA: Diagnosis present

## 2017-02-12 DIAGNOSIS — Z885 Allergy status to narcotic agent status: Secondary | ICD-10-CM

## 2017-02-12 DIAGNOSIS — R410 Disorientation, unspecified: Secondary | ICD-10-CM

## 2017-02-12 DIAGNOSIS — B9620 Unspecified Escherichia coli [E. coli] as the cause of diseases classified elsewhere: Secondary | ICD-10-CM | POA: Diagnosis present

## 2017-02-12 DIAGNOSIS — R296 Repeated falls: Secondary | ICD-10-CM | POA: Diagnosis present

## 2017-02-12 DIAGNOSIS — M059 Rheumatoid arthritis with rheumatoid factor, unspecified: Secondary | ICD-10-CM | POA: Diagnosis present

## 2017-02-12 DIAGNOSIS — F0390 Unspecified dementia without behavioral disturbance: Secondary | ICD-10-CM | POA: Diagnosis present

## 2017-02-12 DIAGNOSIS — G8929 Other chronic pain: Secondary | ICD-10-CM | POA: Diagnosis present

## 2017-02-12 DIAGNOSIS — Z993 Dependence on wheelchair: Secondary | ICD-10-CM

## 2017-02-12 DIAGNOSIS — Z9181 History of falling: Secondary | ICD-10-CM

## 2017-02-12 DIAGNOSIS — M810 Age-related osteoporosis without current pathological fracture: Secondary | ICD-10-CM | POA: Diagnosis present

## 2017-02-12 DIAGNOSIS — G9341 Metabolic encephalopathy: Secondary | ICD-10-CM | POA: Diagnosis present

## 2017-02-12 DIAGNOSIS — Z66 Do not resuscitate: Secondary | ICD-10-CM | POA: Diagnosis present

## 2017-02-12 DIAGNOSIS — Z90711 Acquired absence of uterus with remaining cervical stump: Secondary | ICD-10-CM | POA: Diagnosis not present

## 2017-02-12 DIAGNOSIS — Z96641 Presence of right artificial hip joint: Secondary | ICD-10-CM | POA: Diagnosis present

## 2017-02-12 DIAGNOSIS — A499 Bacterial infection, unspecified: Secondary | ICD-10-CM

## 2017-02-12 DIAGNOSIS — Z884 Allergy status to anesthetic agent status: Secondary | ICD-10-CM

## 2017-02-12 LAB — URINALYSIS, COMPLETE (UACMP) WITH MICROSCOPIC
BACTERIA UA: NONE SEEN
Bilirubin Urine: NEGATIVE
GLUCOSE, UA: NEGATIVE mg/dL
Hgb urine dipstick: NEGATIVE
Ketones, ur: 5 mg/dL — AB
NITRITE: NEGATIVE
Protein, ur: 100 mg/dL — AB
Specific Gravity, Urine: 1.025 (ref 1.005–1.030)
pH: 7 (ref 5.0–8.0)

## 2017-02-12 LAB — COMPREHENSIVE METABOLIC PANEL
ALBUMIN: 3.4 g/dL — AB (ref 3.5–5.0)
ALT: 9 U/L — ABNORMAL LOW (ref 14–54)
ANION GAP: 11 (ref 5–15)
AST: 17 U/L (ref 15–41)
Alkaline Phosphatase: 57 U/L (ref 38–126)
BUN: 16 mg/dL (ref 6–20)
CHLORIDE: 107 mmol/L (ref 101–111)
CO2: 24 mmol/L (ref 22–32)
Calcium: 9.3 mg/dL (ref 8.9–10.3)
Creatinine, Ser: 0.71 mg/dL (ref 0.44–1.00)
GFR calc Af Amer: >60 mL/min (ref >60)
GFR calc non Af Amer: >60 mL/min (ref >60)
GLUCOSE: 90 mg/dL (ref 65–99)
POTASSIUM: 4 mmol/L (ref 3.5–5.1)
SODIUM: 142 mmol/L (ref 135–145)
Total Bilirubin: 0.6 mg/dL (ref 0.3–1.2)
Total Protein: 7.3 g/dL (ref 6.5–8.1)

## 2017-02-12 LAB — CBC WITH DIFFERENTIAL/PLATELET
BASOS PCT: 1 %
Basophils Absolute: 0 10*3/uL (ref 0–0.1)
Eosinophils Absolute: 0 10*3/uL (ref 0–0.7)
Eosinophils Relative: 1 %
HEMATOCRIT: 40.5 % (ref 35.0–47.0)
HEMOGLOBIN: 13.4 g/dL (ref 12.0–16.0)
LYMPHS ABS: 0.9 10*3/uL — AB (ref 1.0–3.6)
LYMPHS PCT: 19 %
MCH: 32.6 pg (ref 26.0–34.0)
MCHC: 33.2 g/dL (ref 32.0–36.0)
MCV: 98.1 fL (ref 80.0–100.0)
MONOS PCT: 7 %
Monocytes Absolute: 0.4 10*3/uL (ref 0.2–0.9)
NEUTROS ABS: 3.5 10*3/uL (ref 1.4–6.5)
NEUTROS PCT: 72 %
Platelets: 228 10*3/uL (ref 150–440)
RBC: 4.12 MIL/uL (ref 3.80–5.20)
RDW: 13.9 % (ref 11.5–14.5)
WBC: 4.8 10*3/uL (ref 3.6–11.0)

## 2017-02-12 LAB — URINE CULTURE: Culture: >=100000 — AB

## 2017-02-12 LAB — TROPONIN I: Troponin I: 0 ng/mL (ref <0.03)

## 2017-02-12 MED ORDER — TRAMADOL HCL 50 MG PO TABS
50.0000 mg | ORAL_TABLET | Freq: Four times a day (QID) | ORAL | Status: DC | PRN
  Administered 2017-02-12 – 2017-02-13 (×2): 50 mg via ORAL
  Filled 2017-02-12 (×2): qty 1

## 2017-02-12 MED ORDER — MEROPENEM 1 G IV SOLR: 1.0000 g | Freq: Three times a day (TID) | INTRAVENOUS | Status: DC

## 2017-02-12 MED ORDER — GENTAMICIN SULFATE 40 MG/ML IJ SOLN: 2.5000 mg/kg | Freq: Once | INTRAMUSCULAR | Status: DC

## 2017-02-12 MED ORDER — ONDANSETRON HCL 4 MG PO TABS
4.0000 mg | ORAL_TABLET | Freq: Four times a day (QID) | ORAL | Status: DC | PRN
  Administered 2017-02-12: 4 mg via ORAL
  Filled 2017-02-12: qty 1

## 2017-02-12 MED ORDER — ZINC OXIDE 40 % EX OINT
1.0000 "application " | TOPICAL_OINTMENT | Freq: Two times a day (BID) | CUTANEOUS | Status: DC | PRN
  Filled 2017-02-12: qty 57

## 2017-02-12 MED ORDER — ONDANSETRON HCL 4 MG/2ML IJ SOLN: 4.0000 mg | Freq: Four times a day (QID) | INTRAMUSCULAR | Status: DC | PRN

## 2017-02-12 MED ORDER — ACETAMINOPHEN 325 MG PO TABS: 650.0000 mg | ORAL_TABLET | Freq: Four times a day (QID) | ORAL | Status: DC | PRN

## 2017-02-12 MED ORDER — SODIUM CHLORIDE 0.9 % IV SOLN
INTRAVENOUS | Status: AC
  Administered 2017-02-12 – 2017-02-13 (×2): via INTRAVENOUS

## 2017-02-12 MED ORDER — SODIUM CHLORIDE 0.9 % IV SOLN
1.0000 g | Freq: Once | INTRAVENOUS | Status: AC
  Administered 2017-02-12: 1 g via INTRAVENOUS
  Filled 2017-02-12: qty 1

## 2017-02-12 MED ORDER — ACETAMINOPHEN 650 MG RE SUPP: 650.0000 mg | Freq: Four times a day (QID) | RECTAL | Status: DC | PRN

## 2017-02-12 MED ORDER — MEROPENEM 1 G IV SOLR
1.0000 g | Freq: Two times a day (BID) | INTRAVENOUS | Status: AC
  Administered 2017-02-12 – 2017-02-13 (×2): 1 g via INTRAVENOUS
  Filled 2017-02-12 (×3): qty 1

## 2017-02-12 MED ORDER — ENOXAPARIN SODIUM 40 MG/0.4ML ~~LOC~~ SOLN
40.0000 mg | SUBCUTANEOUS | Status: DC
  Administered 2017-02-12 – 2017-02-13 (×2): 40 mg via SUBCUTANEOUS
  Filled 2017-02-12 (×2): qty 0.4

## 2017-02-12 MED ORDER — DOCUSATE SODIUM 100 MG PO CAPS
100.0000 mg | ORAL_CAPSULE | Freq: Two times a day (BID) | ORAL | Status: DC
  Administered 2017-02-12 – 2017-02-14 (×4): 100 mg via ORAL
  Filled 2017-02-12 (×4): qty 1

## 2017-02-12 NOTE — ED Notes (Signed)
Hospitalist to bedside at this time 

## 2017-02-12 NOTE — Plan of Care (Signed)
Pt admitted today with UTI. Oriented to person only. Sons at bedside to help answer questions

## 2017-02-12 NOTE — ED Triage Notes (Signed)
Pt in via [facility] from the [facility], per staff, pt with unwitnessed fall, found in bathroom.  Pt with some intermittent confusion upon arrival, unable to specify where her pain is if any.  Vitals WDL, NAD noted at this time.

## 2017-02-12 NOTE — ED Notes (Signed)
Patient transported to X-ray 

## 2017-02-12 NOTE — Plan of Care (Signed)
Patient had pulled IV out prior to assesment, crying about being afraid and hurting.  RN reassured patient of safety and plan of care to care for her.  No new needs verbalized are identified. Continue to asses for needs.

## 2017-02-12 NOTE — ED Provider Notes (Addendum)
[facility] Emergency Department Provider Note  ____________________________________________   First MD Initiated Contact with Patient 02/12/17 1135     (approximate)  I have reviewed the triage vital signs and the nursing notes.   HISTORY  Chief Complaint Fall  Level 5 exemption history limited by the patient's clinical condition  HPI Jillian Horn is a 83 y.o. female is brought to the emergency department via EMS from her nursing home after being found down in her room.  Housekeeping staff found the patient on the floor reporting pain all over her body and unable to get up and unable to ambulate.  The patient is unable to provide any further history.  Chart review she was seen in our emergency department 2 days ago and had a positive urinalysis with cultures below.  She was given 1 g of ceftriaxone and discharged home on cephalexin.  02/10/17 Urine culture:  Escherichia coli    MIC    AMPICILLIN >=32 RESIST... Resistant    AMPICILLIN/SULBACTAM 4 SENSITIVE  Sensitive    CEFAZOLIN >=64 RESIST... Resistant    CEFTRIAXONE >=64 RESIST... Resistant    CIPROFLOXACIN >=4 RESISTANT  Resistant    Extended ESBL POSITIVE  Resistant    GENTAMICIN <=1 SENSITIVE  Sensitive    IMIPENEM 1 SENSITIVE  Sensitive    NITROFURANTOIN <=16 SENSIT... Sensitive    PIP/TAZO <=4 SENSITIVE  Sensitive    TRIMETH/SULFA <=20 SENSIT... Sensitive         Susceptibility Comments      Past Medical History:  Diagnosis Date  . Anemia    erosion on EGD  . Dementia   . Diverticulosis   . Hiatal hernia   . Hypercholesterolemia   . Osteoporosis    s/p fosamax, s/p forteo, reclast  . Rheumatoid arthritis(714.0)    seropositive, oligo-articular.  MTX, s/p plaquenil, cytopenia on sulfasalazine, s/p remicade    Patient Active Problem List   Diagnosis Date Noted  . Hip fracture (HCC) 09/15/2015  . Noncompliance with medications 11/08/2014  . Health care maintenance 05/08/2014    . Rheumatoid arthritis (HCC) 08/10/2012  . Anemia 08/10/2012  . Osteoporosis 08/10/2012  . Hypercholesterolemia 08/10/2012  . Dementia 08/10/2012  . Hiatal hernia 08/10/2012    Past Surgical History:  Procedure Laterality Date  . ABDOMINAL HYSTERECTOMY     partial  . APPENDECTOMY    . BREAST BIOPSY    . HIP ARTHROPLASTY Right 09/16/2015   Procedure: ARTHROPLASTY BIPOLAR HIP (HEMIARTHROPLASTY);  Surgeon: Christena Flake, MD;  Location: [facility] ORS;  Service: Orthopedics;  Laterality: Right;  . KYPHOPLASTY     thoracic spine  . SKIN CANCER EXCISION      Prior to Admission medications   Medication Sig Start Date End Date Taking? Authorizing Provider  barrier cream (NON-SPECIFIED) CREA Apply 1 application topically 2 (two) times daily as needed.    [provider]  cephALEXin (KEFLEX) 500 MG capsule Take 1 capsule (500 mg total) by mouth 2 (two) times daily. 02/10/17   Jene Every, MD  Lorazepam POWD Apply 1 mL topically daily as needed.    [provider]  oxyCODONE (OXY IR/ROXICODONE) 5 MG immediate release tablet Take 1 tablet (5 mg total) by mouth every 4 (four) hours as needed for breakthrough pain. Patient not taking: Reported on 02/10/2017 09/18/15   Enedina Finner, MD    Allergies Codeine sulfate; Lidocaine; Sulfa antibiotics; and Sulfasalazine  Family History  Problem Relation Age of Onset  . Heart attack Mother   .  Stroke Father   . Cancer Sister     Social History Social History   Tobacco Use  . Smoking status: Never Smoker  . Smokeless tobacco: Never Used  Substance Use Topics  . Alcohol use: No    Alcohol/week: 0.0 oz  . Drug use: No    Review of Systems Level 5 exemption history limited by the patient's clinical condition  ____________________________________________   PHYSICAL EXAM:  VITAL SIGNS: ED Triage Vitals  Enc Vitals Group     BP      Pulse      Resp      Temp      Temp src      SpO2      Weight      Height      Head  Circumference      Peak Flow      Pain Score      Pain Loc      Pain Edu?      Excl. in GC?     Constitutional: Confused although in no distress Eyes: PERRL EOMI. Head: Atraumatic. Nose: No congestion/rhinnorhea. Mouth/Throat: No trismus Neck: No stridor.   Cardiovascular: Normal rate, regular rhythm. Grossly normal heart sounds.  Good peripheral circulation. Respiratory: Normal respiratory effort.  No retractions. Lungs CTAB and moving good air Gastrointestinal: Soft nontender Musculoskeletal: Right leg appears slightly shorter than left.  Significant discomfort when ranging both hips Neurologic:  Normal speech and language. No gross focal neurologic deficits are appreciated. Skin:  Skin is warm, dry and intact. No rash noted. Psychiatric: Clearly confused    ____________________________________________   DIFFERENTIAL includes but not limited to  Urinary tract infection, pyelonephritis, bacteremia, metabolic derangement, pneumonia, hip fracture ____________________________________________   LABS (all labs ordered are listed, but only abnormal results are displayed)  Labs Reviewed  COMPREHENSIVE METABOLIC PANEL - Abnormal; Notable for the following components:      Result Value   Albumin 3.4 (*)    ALT 9 (*)    All other components within normal limits  CBC WITH DIFFERENTIAL/PLATELET - Abnormal; Notable for the following components:   Lymphs Abs 0.9 (*)    All other components within normal limits  URINALYSIS, COMPLETE (UACMP) WITH MICROSCOPIC - Abnormal; Notable for the following components:   Color, Urine YELLOW (*)    APPearance CLOUDY (*)    Ketones, ur 5 (*)    Protein, ur 100 (*)    Leukocytes, UA LARGE (*)    Squamous Epithelial / LPF 0-5 (*)    All other components within normal limits  CULTURE, BLOOD (ROUTINE X 2)  CULTURE, BLOOD (ROUTINE X 2)  TROPONIN I    Lab work reviewed by me is consistent with persistent urinary tract  infection __________________________________________  EKG  ED ECG REPORT I, Merrily Brittle, the attending physician, personally viewed and interpreted this ECG.  Date: 02/12/2017 EKG Time:  Rate: 78 Rhythm: normal sinus rhythm QRS Axis: normal Intervals: normal ST/T Wave abnormalities: normal Narrative Interpretation: no evidence of acute ischemia  ____________________________________________  RADIOLOGY  Chest x-ray and bilateral hip x-rays reviewed with no acute disease ____________________________________________   PROCEDURES  Procedure(s) performed: no  Procedures  Critical Care performed: no  Observation: no ____________________________________________   INITIAL IMPRESSION / ASSESSMENT AND PLAN / ED COURSE  Pertinent labs & imaging results that were available during my care of the patient were reviewed by me and considered in my medical decision making (see chart for details).  On chart review the  patient has ESBL cystitis diagnosed 2 days ago.  She has not been on adequate antibiotic coverage.  It is sensitive to meropenem so we will give her a dose now.  She is delirious at this time and likely will require inpatient admission.    Fortunately the patient's imaging is negative for acute pathology.  Her urinalysis is consistent with persistent infection.  Given her falls and delirium she requires inpatient admission for IV fluids, intravenous antibiotics, and serial exams.  I discussed the case with the hospitalist who is graciously agreed to admit the patient her service.  ____________________________________________   FINAL CLINICAL IMPRESSION(S) / ED DIAGNOSES  Final diagnoses:  ESBL (extended spectrum beta-lactamase) producing bacteria infection  Delirium  Acute cystitis without hematuria      NEW MEDICATIONS STARTED DURING THIS VISIT:  This SmartLink is deprecated. Use AVSMEDLIST instead to display the medication list for a patient.   Note:   This document was prepared using Dragon voice recognition software and may include unintentional dictation errors.     Merrily Brittle, MD 02/12/17 1328    Merrily Brittle, MD 02/12/17 1328

## 2017-02-12 NOTE — Progress Notes (Signed)
Pharmacy Antibiotic Note  Jillian Horn is a 83 y.o. female admitted on 02/12/2017 with an ESBL UTI. Patient was previously seen in the ED on 11/26 for flank pain and UTI where she received one dose of IV Rocephin and was discharged with Cephalexin. Patient presented back at ED on 11/28 after fall at nursing home and complaining of pain all over her body. Urine cultures from 11/26 were finalized on 11/28 showing ESBL E coli that was sensitive to meropenem. Pharmacy has been consulted for meropenem dosing.  Patient received one dose of meropenem in the ED.   Plan: Will start meropenem 1g IV Q12H due to patient's CrCl.   Height: 5\' 4"  (162.6 cm) Weight: 115 lb (52.2 kg) IBW/kg (Calculated) : 54.7  Temp (24hrs), Avg:97.9 F (36.6 C), Min:97.9 F (36.6 C), Max:97.9 F (36.6 C)  Recent Labs  Lab 02/10/17 0944 02/12/17 1152  WBC 5.9 4.8  CREATININE 0.75 0.71    Estimated Creatinine Clearance: 43.1 mL/min (by C-G formula based on SCr of 0.71 mg/dL).    Allergies  Allergen Reactions  . Codeine Sulfate Other (See Comments)    Reaction:  Unknown   . Lidocaine Other (See Comments)    Reaction:  Unknown   . Sulfa Antibiotics Other (See Comments)    Reaction:  Unknown   . Sulfasalazine Other (See Comments)    Reaction:  Unknown     Antimicrobials this admission: Meropenem 11/28  >>   Dose adjustments this admission:   Microbiology results: BCx: 11/28 >> sent  UCx: 11/28 >> ESBL Ecoli    Thank you for allowing pharmacy to be a part of this patient's care.  12/28, PharmD Pharmacy Resident 02/12/2017 3:07 PM

## 2017-02-12 NOTE — H&P (Signed)
[facility] - [place] at [facility]   PATIENT NAME: Jillian Horn    MR#:  [id number]  DATE OF BIRTH:  08-24-1929  DATE OF ADMISSION:  02/12/2017  PRIMARY CARE PHYSICIAN: Dale [place], MD   REQUESTING/REFERRING PHYSICIAN: Merrily Brittle, MD  CHIEF COMPLAINT:   ams HISTORY OF PRESENT ILLNESS:  Jillian Horn  is a 83 y.o. female with a known history of dementia, hyponatremia, chronic ill chair bound since her hip surgery last year was found more confused today and housekeeping staff found the patient on floor reporting pain all over her body and unable to get up. Patient is brought into the ED. patient was seen by emergency department 2 days ago and she was given IV Rocephin one-time dose and discharged with by mouth Keflex for positive urinalysis. No urine culture is revealing ESBL Escherichia coli. Patient is started on meropenem and hospitalist team is called to admit the patient. Patient is a poor historian. Son at bedside.  PAST MEDICAL HISTORY:   Past Medical History:  Diagnosis Date  . Anemia    erosion on EGD  . Dementia   . Diverticulosis   . Hiatal hernia   . Hypercholesterolemia   . Osteoporosis    s/p fosamax, s/p forteo, reclast  . Rheumatoid arthritis(714.0)    seropositive, oligo-articular.  MTX, s/p plaquenil, cytopenia on sulfasalazine, s/p remicade    PAST SURGICAL HISTOIRY:   Past Surgical History:  Procedure Laterality Date  . ABDOMINAL HYSTERECTOMY     partial  . APPENDECTOMY    . BREAST BIOPSY    . HIP ARTHROPLASTY Right 09/16/2015   Procedure: ARTHROPLASTY BIPOLAR HIP (HEMIARTHROPLASTY);  Surgeon: Christena Flake, MD;  Location: [facility] ORS;  Service: Orthopedics;  Laterality: Right;  . KYPHOPLASTY     thoracic spine  . SKIN CANCER EXCISION      SOCIAL HISTORY:   Social History   Tobacco Use  . Smoking status: Never Smoker  . Smokeless tobacco: Never Used  Substance Use Topics  . Alcohol use: No    Alcohol/week: 0.0  oz    FAMILY HISTORY:   Family History  Problem Relation Age of Onset  . Heart attack Mother   . Stroke Father   . Cancer Sister     DRUG ALLERGIES:   Allergies  Allergen Reactions  . Codeine Sulfate Other (See Comments)    Reaction:  Unknown   . Lidocaine Other (See Comments)    Reaction:  Unknown   . Sulfa Antibiotics Other (See Comments)    Reaction:  Unknown   . Sulfasalazine Other (See Comments)    Reaction:  Unknown     REVIEW OF SYSTEMS:  Review of systems unobtainable as the patient is demented and altered  MEDICATIONS AT HOME:   Prior to Admission medications   Medication Sig Start Date End Date Taking? Authorizing Provider  barrier cream (NON-SPECIFIED) CREA Apply 1 application topically 2 (two) times daily as needed.    [provider]  cephALEXin (KEFLEX) 500 MG capsule Take 1 capsule (500 mg total) by mouth 2 (two) times daily. 02/10/17   Jene Every, MD  Lorazepam POWD Apply 1 mL topically daily as needed.    [provider]  oxyCODONE (OXY IR/ROXICODONE) 5 MG immediate release tablet Take 1 tablet (5 mg total) by mouth every 4 (four) hours as needed for breakthrough pain. Patient not taking: Reported on 02/10/2017 09/18/15   Enedina Finner, MD      VITAL SIGNS:  Blood  pressure 106/73, pulse 70, temperature 97.9 F (36.6 C), temperature source Oral, resp. rate 20, height 5\' 4"  (1.626 m), weight 52.2 kg (115 lb), SpO2 100 %.  PHYSICAL EXAMINATION:  GENERAL:  83 y.o.-year-old patient lying in the bed with no acute distress.  EYES: Pupils equal, round, reactive to light and accommodation. No scleral icterus.  HEENT: Head atraumatic, normocephalic. Oropharynx and nasopharynx clear.  NECK:  Supple, no jugular venous distention. No thyroid enlargement, no tenderness.  LUNGS: Normal breath sounds bilaterally, no wheezing, rales,rhonchi or crepitation. No use of accessory muscles of respiration.  CARDIOVASCULAR: S1, S2 normal. No murmurs, rubs,  or gallops.  ABDOMEN: Soft, nontender, nondistended. Bowel sounds present.  EXTREMITIES: No pedal edema, cyanosis, or clubbing.  NEUROLOGIC: Arousable but altered and disoriented PSYCHIATRIC: The patient is delirious and disoriented  SKIN: No obvious rash, lesion, or ulcer.   LABORATORY PANEL:   CBC Recent Labs  Lab 02/12/17 1152  WBC 4.8  HGB 13.4  HCT 40.5  PLT 228   ------------------------------------------------------------------------------------------------------------------  Chemistries  Recent Labs  Lab 02/12/17 1152  NA 142  K 4.0  CL 107  CO2 24  GLUCOSE 90  BUN 16  CREATININE 0.71  CALCIUM 9.3  AST 17  ALT 9*  ALKPHOS 57  BILITOT 0.6   ------------------------------------------------------------------------------------------------------------------  Cardiac Enzymes Recent Labs  Lab 02/12/17 1152  TROPONINI 0.00   ------------------------------------------------------------------------------------------------------------------  RADIOLOGY:  Dg Chest 1 View  Result Date: 02/12/2017 CLINICAL DATA:  Chest pain after fall. EXAM: CHEST 1 VIEW COMPARISON:  Radiograph of September 15, 2015. FINDINGS: Stable cardiomediastinal silhouette. Stable large hiatal hernia. Atherosclerosis of thoracic aorta is noted. No pneumothorax or pleural effusion is noted. No acute pulmonary disease is noted. Bony thorax is unremarkable. IMPRESSION: Aortic atherosclerosis. Stable large hiatal hernia. No acute cardiopulmonary abnormality seen. Electronically Signed   By: Lupita Raider, M.D.   On: 02/12/2017 13:01   Dg Hip Unilat With Pelvis 2-3 Views Left  Result Date: 02/12/2017 CLINICAL DATA:  Status post fall. EXAM: DG HIP (WITH OR WITHOUT PELVIS) 2-3V LEFT COMPARISON:  None. FINDINGS: There is no evidence of hip fracture or dislocation. There is generalized osteopenia. There is no evidence of arthropathy or other focal bone abnormality. IMPRESSION: No acute osseous injury of the  left hip. Given the patient's age and osteopenia, if there is persistent clinical concern for an occult hip fracture, a MRI of the hip is recommended for increased sensitivity. Electronically Signed   By: Elige Ko   On: 02/12/2017 13:01   Dg Hip Unilat With Pelvis 2-3 Views Right  Result Date: 02/12/2017 CLINICAL DATA:  Right hip pain after fall last night. EXAM: DG HIP (WITH OR WITHOUT PELVIS) 2-3V RIGHT COMPARISON:  Radiographs of September 16, 2015. FINDINGS: Status post right hip arthroplasty. No fracture or dislocation is noted. IMPRESSION: No acute abnormality seen in the right hip. Electronically Signed   By: Lupita Raider, M.D.   On: 02/12/2017 13:02    EKG:   Orders placed or performed during the hospital encounter of 02/12/17  . ED EKG  . ED EKG  . EKG 12-Lead  . EKG 12-Lead    IMPRESSION AND PLAN:  Jillian Horn  is a 83 y.o. female with a known history of dementia, hyponatremia, chronic ill chair bound since her hip surgery last year was found more confused today and housekeeping staff found the patient on floor reporting pain all over her body and unable to get up. Patient is brought into  the ED  #Acute encephalopathy from acute cystitis and chronic dementia Admitted to MedSurg floor Meropenem Follow-up of the blood and urine cultures  # Acute cystitis with ESBL ecoli IV meropenem, follow-up in the urine culture and sensitivity, blood culture and sensitivity Hydrate with IV fluids  #Fall Patient is chronically wheelchair bound since her hip fracture 1 year ago, currently altered and sustained a fall when she tried to stand up by herself PT eval  #Hyperlipidemia Statin    All the records are reviewed and case discussed with ED provider. Management plans discussed with the patient's son and he is in agreement.  CODE STATUS: DNR,son is HCPOA  TOTAL TIME TAKING CARE OF THIS PATIENT: 43 minutes.   Note: This dictation was prepared with Dragon dictation along with  smaller phrase technology. Any transcriptional errors that result from this process are unintentional.  Ramonita Lab M.D on 02/12/2017 at 6:21 PM  Between 7am to 6pm - Pager - [phone]  After 6pm go to [email or web address] - password [email or web address] [facility]  [place] [facility]  Office  [phone]  CC: Primary care physician; Dale , MD

## 2017-02-13 LAB — COMPREHENSIVE METABOLIC PANEL
ALBUMIN: 2.9 g/dL — AB (ref 3.5–5.0)
ALT: 8 U/L — AB (ref 14–54)
AST: 14 U/L — AB (ref 15–41)
Alkaline Phosphatase: 52 U/L (ref 38–126)
Anion gap: 5 (ref 5–15)
BUN: 15 mg/dL (ref 6–20)
CHLORIDE: 111 mmol/L (ref 101–111)
CO2: 25 mmol/L (ref 22–32)
CREATININE: 0.61 mg/dL (ref 0.44–1.00)
Calcium: 8.4 mg/dL — ABNORMAL LOW (ref 8.9–10.3)
GFR calc Af Amer: >60 mL/min (ref >60)
GFR calc non Af Amer: >60 mL/min (ref >60)
Glucose, Bld: 68 mg/dL (ref 65–99)
POTASSIUM: 3.6 mmol/L (ref 3.5–5.1)
Sodium: 141 mmol/L (ref 135–145)
Total Bilirubin: 0.9 mg/dL (ref 0.3–1.2)
Total Protein: 6.3 g/dL — ABNORMAL LOW (ref 6.5–8.1)

## 2017-02-13 LAB — CBC
HEMATOCRIT: 37 % (ref 35.0–47.0)
HEMOGLOBIN: 12.2 g/dL (ref 12.0–16.0)
MCH: 32.7 pg (ref 26.0–34.0)
MCHC: 33 g/dL (ref 32.0–36.0)
MCV: 99 fL (ref 80.0–100.0)
Platelets: 203 10*3/uL (ref 150–440)
RBC: 3.74 MIL/uL — AB (ref 3.80–5.20)
RDW: 13.7 % (ref 11.5–14.5)
WBC: 3.6 10*3/uL (ref 3.6–11.0)

## 2017-02-13 LAB — URINE CULTURE: Culture: NO GROWTH

## 2017-02-13 LAB — GLUCOSE, CAPILLARY: GLUCOSE-CAPILLARY: 86 mg/dL (ref 65–99)

## 2017-02-13 MED ORDER — PIPERACILLIN-TAZOBACTAM 3.375 G IVPB: 3.3750 g | Freq: Three times a day (TID) | INTRAVENOUS | Status: DC

## 2017-02-13 MED ORDER — LORAZEPAM 2 MG/ML IJ SOLN
1.0000 mg | Freq: Four times a day (QID) | INTRAMUSCULAR | Status: DC | PRN
  Administered 2017-02-13 – 2017-02-14 (×2): 1 mg via INTRAVENOUS
  Filled 2017-02-13 (×2): qty 1

## 2017-02-13 MED ORDER — SODIUM CHLORIDE 0.9 % IV SOLN
1.0000 g | Freq: Two times a day (BID) | INTRAVENOUS | Status: DC
  Administered 2017-02-13 – 2017-02-14 (×2): 1 g via INTRAVENOUS
  Filled 2017-02-13 (×3): qty 1

## 2017-02-13 MED ORDER — SODIUM CHLORIDE 0.9 % IV SOLN
INTRAVENOUS | Status: DC
  Administered 2017-02-13 – 2017-02-14 (×2): via INTRAVENOUS

## 2017-02-13 NOTE — Progress Notes (Signed)
[facility] - [place] at [facility]   PATIENT NAME: Jillian Horn    MR#:  [id number]  DATE OF BIRTH:  22-Oct-1929  SUBJECTIVE:  CHIEF COMPLAINT:   Chief Complaint  Patient presents with  . Fall  reports chronic back pain and diffcullty walking, recurrent falls REVIEW OF SYSTEMS:  Review of Systems  Constitutional: Positive for malaise/fatigue. Negative for chills, fever and weight loss.  HENT: Negative for nosebleeds and sore throat.   Eyes: Negative for blurred vision.  Respiratory: Negative for cough, shortness of breath and wheezing.   Cardiovascular: Negative for chest pain, orthopnea, leg swelling and PND.  Gastrointestinal: Negative for abdominal pain, constipation, diarrhea, heartburn, nausea and vomiting.  Genitourinary: Negative for dysuria and urgency.  Musculoskeletal: Positive for back pain and falls.  Skin: Negative for rash.  Neurological: Positive for weakness. Negative for dizziness, speech change, focal weakness and headaches.  Endo/Heme/Allergies: Does not bruise/bleed easily.  Psychiatric/Behavioral: Negative for depression.    DRUG ALLERGIES:   Allergies  Allergen Reactions  . Codeine Sulfate Other (See Comments)    Reaction:  Unknown   . Lidocaine Other (See Comments)    Reaction:  Unknown   . Sulfa Antibiotics Other (See Comments)    Reaction:  Unknown   . Sulfasalazine Other (See Comments)    Reaction:  Unknown    VITALS:  Blood pressure 134/74, pulse 70, temperature 98.4 F (36.9 C), temperature source Oral, resp. rate 18, height 5\' 4"  (1.626 m), weight 52.2 kg (115 lb), SpO2 99 %. PHYSICAL EXAMINATION:  Physical Exam  Constitutional: She is oriented to person, place, and time. She appears malnourished. She appears unhealthy. She appears cachectic. She has a sickly appearance.  HENT:  Head: Normocephalic and atraumatic.  Eyes: Conjunctivae and EOM are normal. Pupils are equal, round, and reactive to light.  Neck: Normal  range of motion. Neck supple. No tracheal deviation present. No thyromegaly present.  Cardiovascular: Normal rate, regular rhythm and normal heart sounds.  Pulmonary/Chest: Effort normal and breath sounds normal. No respiratory distress. She has no wheezes. She exhibits no tenderness.  Abdominal: Soft. Bowel sounds are normal. She exhibits no distension. There is no tenderness.  Musculoskeletal: Normal range of motion.  Neurological: She is alert and oriented to person, place, and time. No cranial nerve deficit.  Skin: Skin is warm and dry. No rash noted.  Psychiatric: Mood and affect normal.   LABORATORY PANEL:  Female CBC Recent Labs  Lab 02/13/17 0443  WBC 3.6  HGB 12.2  HCT 37.0  PLT 203   ------------------------------------------------------------------------------------------------------------------ Chemistries  Recent Labs  Lab 02/13/17 0443  NA 141  K 3.6  CL 111  CO2 25  GLUCOSE 68  BUN 15  CREATININE 0.61  CALCIUM 8.4*  AST 14*  ALT 8*  ALKPHOS 52  BILITOT 0.9   RADIOLOGY:  No results found. ASSESSMENT AND PLAN:  Jillian Horn  is a 83 y.o. female with a known history of dementia, hyponatremia, chronic ill chair bound since her hip surgery last year was found more confused and housekeeping staff found the patient on floor reporting pain all over her body and unable to get up. Patient is admitted for AMS  #Acute Metabolic encephalopathy from acute ESBL UTI - slowly improving  # Acute cystitis with ESBL ecoli - change meropenem to zosyn per pharmacy - ID c/s, continue IV fluids  #Fall - high risk for recurrent falls - PT eval   #Hyperlipidemia Statin   Palliative care c/s -  may benefit from Palliative care to follow at the facility or Hospice if she qualifies, overall poor prognosis    All the records are reviewed and case discussed with Care Management/Social Worker. Management plans discussed with the patient, nursing and they are in  agreement.  CODE STATUS: DNR  TOTAL TIME TAKING CARE OF THIS PATIENT: 35 minutes.   More than 50% of the time was spent in counseling/coordination of care: YES  POSSIBLE D/C IN 1-2 DAYS, DEPENDING ON CLINICAL CONDITION.   Delfino Lovett M.D on 02/13/2017 at 2:33 PM  Between 7am to 6pm - Pager - [phone]  After 6pm go to [email or web address] - Social research officer, government  [facility] [place] [facility]  Office  [phone]  CC: Primary care physician; Dale [place], MD  Note: This dictation was prepared with Dragon dictation along with smaller phrase technology. Any transcriptional errors that result from this process are unintentional.

## 2017-02-13 NOTE — Progress Notes (Signed)
ID E note 83 yo with dementia admitted with confusion and on floor. Found to have UTI and UCX with ESBL E coli.  On initial presentation to ED 11/26 her wbc was nml,  UA had 0-5 wbc but on repeat had TNTC on 11/28. UCX 11/26 with ESBL E coli.  Started on meropenem 11/28.  Despite sensitivity invitro to zosyn I would not suggest using this as often fails with ESBL infections.  REc Cont meropenem today. When ready for dc can give a dose of ertapenem (would be 3rd day of treatment) and then 5 days or oral macrobid which can be used down to Crcl of 30.  I will see tomorrow

## 2017-02-13 NOTE — Progress Notes (Signed)
Pharmacy Antibiotic Note  Jillian Horn is a 83 y.o. female admitted on 02/12/2017 with ESBL E.coli UTI.  Pharmacy has been consulted for meropenem dosing.  ID following.  Plan: Meropenem 1 g IV q12h  Height: 5\' 4"  (162.6 cm) Weight: 115 lb (52.2 kg) IBW/kg (Calculated) : 54.7  Temp (24hrs), Avg:98 F (36.7 C), Min:97.6 F (36.4 C), Max:98.4 F (36.9 C)  Recent Labs  Lab 02/10/17 0944 02/12/17 1152 02/13/17 0443  WBC 5.9 4.8 3.6  CREATININE 0.75 0.71 0.61    Estimated Creatinine Clearance: 43.1 mL/min (by C-G formula based on SCr of 0.61 mg/dL).    Allergies  Allergen Reactions  . Codeine Sulfate Other (See Comments)    Reaction:  Unknown   . Lidocaine Other (See Comments)    Reaction:  Unknown   . Sulfa Antibiotics Other (See Comments)    Reaction:  Unknown   . Sulfasalazine Other (See Comments)    Reaction:  Unknown    Antimicrobials this admission: meropenem 11/29 >>   Dose adjustments this admission:  Microbiology results: 11/28 BCx: No growth < 24 hours 11/26 UCx: ESBL E.coli   Thank you for allowing pharmacy to be a part of this patient's care.  12/26, PharmD, BCPS Clinical Pharmacist 02/13/2017 4:09 PM

## 2017-02-14 LAB — CBC
HEMATOCRIT: 38.1 % (ref 35.0–47.0)
Hemoglobin: 12.5 g/dL (ref 12.0–16.0)
MCH: 32.6 pg (ref 26.0–34.0)
MCHC: 32.9 g/dL (ref 32.0–36.0)
MCV: 99.1 fL (ref 80.0–100.0)
PLATELETS: 208 10*3/uL (ref 150–440)
RBC: 3.85 MIL/uL (ref 3.80–5.20)
RDW: 13.5 % (ref 11.5–14.5)
WBC: 4.2 10*3/uL (ref 3.6–11.0)

## 2017-02-14 LAB — BASIC METABOLIC PANEL
ANION GAP: 6 (ref 5–15)
BUN: 11 mg/dL (ref 6–20)
CALCIUM: 8.4 mg/dL — AB (ref 8.9–10.3)
CO2: 24 mmol/L (ref 22–32)
CREATININE: 0.57 mg/dL (ref 0.44–1.00)
Chloride: 109 mmol/L (ref 101–111)
Glucose, Bld: 91 mg/dL (ref 65–99)
Potassium: 3.5 mmol/L (ref 3.5–5.1)
SODIUM: 139 mmol/L (ref 135–145)

## 2017-02-14 MED ORDER — LORAZEPAM 0.5 MG PO TABS: 0.5000 mg | ORAL_TABLET | Freq: Three times a day (TID) | ORAL | 0 refills | Status: DC | PRN

## 2017-02-14 MED ORDER — MORPHINE SULFATE (CONCENTRATE) 10 MG /0.5 ML PO SOLN: 5.0000 mg | ORAL | 0 refills | Status: DC | PRN

## 2017-02-14 MED ORDER — NITROFURANTOIN MONOHYD MACRO 100 MG PO CAPS: 100.0000 mg | ORAL_CAPSULE | Freq: Two times a day (BID) | ORAL | 0 refills | Status: AC

## 2017-02-14 NOTE — Discharge Summary (Signed)
[facility] - [place] at [facility]   PATIENT NAME: Jillian Horn    MR#:  [id number]  DATE OF BIRTH:  April 27, 1929  DATE OF ADMISSION:  02/12/2017   ADMITTING PHYSICIAN: Ramonita Lab, MD  DATE OF DISCHARGE: 02/14/2017  PRIMARY CARE PHYSICIAN: Dale [place], MD   ADMISSION DIAGNOSIS:  Delirium [R41.0] ESBL (extended spectrum beta-lactamase) producing bacteria infection [A49.9, Z16.12] Acute cystitis without hematuria [N30.00] DISCHARGE DIAGNOSIS:  Active Problems:   Acute cystitis  SECONDARY DIAGNOSIS:   Past Medical History:  Diagnosis Date  . Anemia    erosion on EGD  . Dementia   . Diverticulosis   . Hiatal hernia   . Hypercholesterolemia   . Osteoporosis    s/p fosamax, s/p forteo, reclast  . Rheumatoid arthritis(714.0)    seropositive, oligo-articular.  MTX, s/p plaquenil, cytopenia on sulfasalazine, s/p remicade   HOSPITAL COURSE:  MaryChandleris a83 y.o.femalewith a known history of dementia, hyponatremia, chronic ill chair bound since her hip surgery last year was found more confused and housekeeping staff found the patient on floor reporting pain all over her body and unable to get up.Patient is admitted for AMS  #Acute Metabolic encephalopathy from acute ESBL UTI  # Acute cystitis with ESBL ecoli - treated with IV meropenem while here abd being D/Ced on PO macrobid per ID recommendations  #Fall - high risk for recurrent falls  #Hyperlipidemia  DISCHARGE CONDITIONS:  FAIR CONSULTS OBTAINED:  Treatment Team:  Mick Sell, MD DRUG ALLERGIES:   Allergies  Allergen Reactions  . Codeine Sulfate Other (See Comments)    Reaction:  Unknown   . Lidocaine Other (See Comments)    Reaction:  Unknown   . Sulfa Antibiotics Other (See Comments)    Reaction:  Unknown   . Sulfasalazine Other (See Comments)    Reaction:  Unknown    DISCHARGE MEDICATIONS:   Allergies as of 02/14/2017      Reactions   Codeine Sulfate  Other (See Comments)   Reaction:  Unknown    Lidocaine Other (See Comments)   Reaction:  Unknown    Sulfa Antibiotics Other (See Comments)   Reaction:  Unknown    Sulfasalazine Other (See Comments)   Reaction:  Unknown       Medication List    STOP taking these medications   cephALEXin 500 MG capsule Commonly known as:  KEFLEX     TAKE these medications   barrier cream Crea Commonly known as:  non-specified Apply 1 application topically 2 (two) times daily as needed.   LORazepam 0.5 MG tablet Commonly known as:  ATIVAN Take 1 tablet (0.5 mg total) by mouth every 8 (eight) hours as needed for anxiety or sleep.   Lorazepam Powd Apply 1 mL topically daily as needed.   morphine CONCENTRATE 10 mg / 0.5 ml concentrated solution Take 0.25 mLs (5 mg total) by mouth every 2 (two) hours as needed for severe pain.   nitrofurantoin (macrocrystal-monohydrate) 100 MG capsule Commonly known as:  MACROBID Take 1 capsule (100 mg total) by mouth 2 (two) times daily for 5 days.   oxyCODONE 5 MG immediate release tablet Commonly known as:  Oxy IR/ROXICODONE Take 1 tablet (5 mg total) by mouth every 4 (four) hours as needed for breakthrough pain.        DISCHARGE INSTRUCTIONS:    DIET:  Regular diet DISCHARGE CONDITION:  Fair ACTIVITY:  Activity as tolerated OXYGEN:  Home Oxygen: No.  Oxygen Delivery: room air DISCHARGE LOCATION:  [place] [facility] to follow. Please avoid readmission/Hospital visits as family chose comfort care/Hospice   If you experience worsening of your admission symptoms, develop shortness of breath, life threatening emergency, suicidal or homicidal thoughts you must seek medical attention immediately by calling 911 or calling your MD immediately  if symptoms less severe.  You Must read complete instructions/literature along with all the possible adverse reactions/side effects for all the Medicines you take and that have been prescribed to you.  Take any new Medicines after you have completely understood and accpet all the possible adverse reactions/side effects.   Please note  You were cared for by a hospitalist during your hospital stay. If you have any questions about your discharge medications or the care you received while you were in the hospital after you are discharged, you can call the unit and asked to speak with the hospitalist on call if the hospitalist that took care of you is not available. Once you are discharged, your primary care physician will handle any further medical issues. Please note that NO REFILLS for any discharge medications will be authorized once you are discharged, as it is imperative that you return to your primary care physician (or establish a relationship with a primary care physician if you do not have one) for your aftercare needs so that they can reassess your need for medications and monitor your lab values.    On the day of Discharge:  VITAL SIGNS:  Blood pressure (!) 151/75, pulse 66, temperature 97.9 F (36.6 C), temperature source Oral, resp. rate 18, height 5\' 4"  (1.626 m), weight 52.2 kg (115 lb), SpO2 100 %. PHYSICAL EXAMINATION:  GENERAL:  83 y.o.-year-old patient lying in the bed with no acute distress.  EYES: Pupils equal, round, reactive to light and accommodation. No scleral icterus. Extraocular muscles intact.  HEENT: Head atraumatic, normocephalic. Oropharynx and nasopharynx clear.  NECK:  Supple, no jugular venous distention. No thyroid enlargement, no tenderness.  LUNGS: Normal breath sounds bilaterally, no wheezing, rales,rhonchi or crepitation. No use of accessory muscles of respiration.  CARDIOVASCULAR: S1, S2 normal. No murmurs, rubs, or gallops.  ABDOMEN: Soft, non-tender, non-distended. Bowel sounds present. No organomegaly or mass.  EXTREMITIES: No pedal edema, cyanosis, or clubbing.  NEUROLOGIC: Cranial nerves II through XII are intact. Muscle strength 5/5 in all  extremities. Sensation intact. Gait not checked.  PSYCHIATRIC: The patient is alert and oriented x 3.  SKIN: No obvious rash, lesion, or ulcer.  DATA REVIEW:   CBC Recent Labs  Lab 02/14/17 0514  WBC 4.2  HGB 12.5  HCT 38.1  PLT 208    Chemistries  Recent Labs  Lab 02/13/17 0443 02/14/17 0514  NA 141 139  K 3.6 3.5  CL 111 109  CO2 25 24  GLUCOSE 68 91  BUN 15 11  CREATININE 0.61 0.57  CALCIUM 8.4* 8.4*  AST 14*  --   ALT 8*  --   ALKPHOS 52  --   BILITOT 0.9  --      Follow-up Information    02/16/17, MD. Go on 02/24/2017.   Specialty:  Internal Medicine Why:  Monday at 10:00am for hospital follow-up Contact information: [address] [place] [phone]           Management plans discussed with the patient, family (both sons at bedside) and they are in agreement.  CODE STATUS: DNR   TOTAL TIME TAKING CARE OF THIS PATIENT: 45 minutes.  Delfino Lovett M.D on 02/14/2017 at 9:57 AM  Between 7am to 6pm - Pager - [phone]  After 6pm go to [email or web address] - Social research officer, government  [facility] [place] [facility]  Office  [phone]  CC: Primary care physician; Dale [place], MD   Note: This dictation was prepared with Dragon dictation along with smaller phrase technology. Any transcriptional errors that result from this process are unintentional.

## 2017-02-14 NOTE — Care Management Important Message (Signed)
Important Message  Patient Details  Name: Jillian Horn MRN: [id number] Date of Birth: Jul 20, 1929   Medicare Important Message Given:  N/A - LOS <3 / Initial given by admissions    Chapman Fitch, RN 02/14/2017, 9:50 AM

## 2017-02-14 NOTE — Plan of Care (Signed)
Patient discharging to facility with hospice. No need for consult today per Dr. Sherryll Burger.

## 2017-02-14 NOTE — Progress Notes (Signed)
Pt prepared for d/c to [facility] at [place]. IV d/c'd. Skin intact except as charted in most recent assessments. Vitals are stable. Report called to receiving facility. Pt transported by ambulance service.  Edythe Riches [employer]

## 2017-02-14 NOTE — NC FL2 (Signed)
  [place] MEDICAID FL2 LEVEL OF CARE SCREENING TOOL     IDENTIFICATION  Patient Name: Jillian Horn Birthdate: 04-11-1929 Sex: female Admission Date (Current Location): 02/12/2017  [facility] and [place] Number:  Chiropodist and Address:  [facility], [address], [place], [place] [address]      Provider Number: [id number]  Attending Physician Name and Address:  Delfino Lovett, MD  Relative Name and Phone Number:       Current Level of Care: Hospital Recommended Level of Care: Assisted Living Facility Prior Approval Number:    Date Approved/Denied:   PASRR Number:    Discharge Plan: (ALF)    Current Diagnoses: Patient Active Problem List   Diagnosis Date Noted  . Acute cystitis 02/12/2017  . Hip fracture (HCC) 09/15/2015  . Noncompliance with medications 11/08/2014  . Health care maintenance 05/08/2014  . Rheumatoid arthritis (HCC) 08/10/2012  . Anemia 08/10/2012  . Osteoporosis 08/10/2012  . Hypercholesterolemia 08/10/2012  . Dementia 08/10/2012  . Hiatal hernia 08/10/2012    Orientation RESPIRATION BLADDER Height & Weight     Self  Normal Incontinent Weight: 115 lb (52.2 kg) Height:  5\' 4"  (162.6 cm)  BEHAVIORAL SYMPTOMS/MOOD NEUROLOGICAL BOWEL NUTRITION STATUS  (none) (none) Continent Diet  AMBULATORY STATUS COMMUNICATION OF NEEDS Skin   Limited Assist Verbally Normal                       Personal Care Assistance Level of Assistance  Bathing, Dressing Bathing Assistance: Limited assistance   Dressing Assistance: Limited assistance     Functional Limitations Info  Sight Sight Info: (none)        SPECIAL CARE FACTORS FREQUENCY  (Hospice to Follow) : [facility]  Isolation: ESBL urine                    Contractures Contractures Info: Not present    Additional Factors Info  Code Status Code Status Info: dnr             Medication List     STOP taking these  medications   cephALEXin 500 MG capsule Commonly known as:  KEFLEX     TAKE these medications   barrier cream Crea Commonly known as:  non-specified Apply 1 application topically 2 (two) times daily as needed.   LORazepam 0.5 MG tablet Commonly known as:  ATIVAN Take 1 tablet (0.5 mg total) by mouth every 8 (eight) hours as needed for anxiety or sleep.   Lorazepam Powd Apply 1 mL topically daily as needed.   morphine CONCENTRATE 10 mg / 0.5 ml concentrated solution Take 0.25 mLs (5 mg total) by mouth every 2 (two) hours as needed for severe pain.   nitrofurantoin (macrocrystal-monohydrate) 100 MG capsule Commonly known as:  MACROBID Take 1 capsule (100 mg total) by mouth 2 (two) times daily for 5 days.   oxyCODONE 5 MG immediate release tablet Commonly known as:  Oxy IR/ROXICODONE Take 1 tablet (5 mg total) by mouth every 4 (four) hours as needed for breakthrough pain.         Additional Information    , LCSW

## 2017-02-14 NOTE — Progress Notes (Signed)
New referral for [facility] services at [facility] received from [place]. Patient is an 83 year old woman with a PMH of dementia, Rheumatoid arthritis, diverticulosis, osteoporosis, HLD and anemia brought to the [facility] ED for evaluation of increased confusion and fall. She was found to have ESBL UTI and admitted for treatment. Attending physician Dr. Manuella Ghazi spoke with family this morning regarding patient's decline and poor prognosis related to recurrent falls, poor appetite and UTI's. They have chosen for her to discharge back to [facility] with the support of Hospice services. Writer met in the room with patient's sons Jillian Horn and Jillian Horn to initiate education regarding hospice services, philosophy and team approach to care with good understanding voiced. Questions answered. No DME needed at this time. Patient information faxed to referral. Plan is for discharge today via EMS with signed DNR in place. Hospital care team updated. Flo Shanks Rn, BSN, [facility] liaison [phone] c

## 2017-02-14 NOTE — Clinical Social Work Note (Signed)
Clinical Social Work Assessment  Patient Details  Name: Jillian Horn MRN: [id number] Date of Birth: 10-29-1929  Date of referral:  02/14/17               Reason for consult:  Discharge Planning                Permission sought to share information with:    Permission granted to share information::     Name::        Agency::     Relationship::     Contact Information:     Housing/Transportation Living arrangements for the past 2 months:  Assisted Living Facility Source of Information:  Facility, Adult Children Patient Interpreter Needed:  None Criminal Activity/Legal Involvement Pertinent to Current Situation/Hospitalization:  No - Comment as needed Significant Relationships:  Adult Children Lives with:  Facility Resident Do you feel safe going back to the place where you live?  Yes Need for family participation in patient care:  Yes (Comment)  Care giving concerns:  Patient is a long term resident at [facility].   Social Worker assessment / plan:  MD is discharging patient today to return to [facility] with hospice. Patient's sons are in agreement with [facility]. CSW spoke with the sons and they are in agreement with discharge today. CSW spoke with Hospital doctor at [facility] and informed her of the ESBL in urine and asked if this would be an issue for them and Hospital doctor spoke with their administrator and patient can return today with hospice. Referral given to [facility] with [facility].  Employment status:  Retired Database administrator PT Recommendations:    Information / Referral to community resources:     Patient/Family's Response to care:  Patient's sons expressed appreciation for CSW assistance.  Patient/Family's Understanding of and Emotional Response to Diagnosis, Current Treatment, and Prognosis:  Patient is confused at baseline and patient's sons state that they want hospice.  Emotional Assessment Appearance:  Appears stated  age Attitude/Demeanor/Rapport:    Affect (typically observed):    Orientation:  Oriented to Self Alcohol / Substance use:  Not Applicable Psych involvement (Current and /or in the community):  No (Comment)  Discharge Needs  Concerns to be addressed:  Care Coordination Readmission within the last 30 days:  No Current discharge risk:  None Barriers to Discharge:  No Barriers Identified   York Spaniel, LCSW 02/14/2017, 10:11 AM

## 2017-02-14 NOTE — Discharge Instructions (Signed)

## 2017-02-17 LAB — CULTURE, BLOOD (ROUTINE X 2)
Culture: NO GROWTH
Culture: NO GROWTH

## 2017-02-20 ENCOUNTER — Emergency Department
Admission: EM | Admit: 2017-02-20 | Discharge: 2017-02-20 | Disposition: A | Discharge status: Home / Self Care | Payer: Medicare Other | Attending: Emergency Medicine | Admitting: Emergency Medicine

## 2017-02-20 ENCOUNTER — Other Ambulatory Visit: Payer: Self-pay

## 2017-02-20 ENCOUNTER — Encounter: Payer: Self-pay | Admitting: Emergency Medicine

## 2017-02-20 DIAGNOSIS — Z5321 Procedure and treatment not carried out due to patient leaving prior to being seen by health care provider: Secondary | ICD-10-CM | POA: Diagnosis not present

## 2017-02-20 DIAGNOSIS — M549 Dorsalgia, unspecified: Secondary | ICD-10-CM | POA: Insufficient documentation

## 2017-02-20 DIAGNOSIS — R55 Syncope and collapse: Secondary | ICD-10-CM | POA: Diagnosis not present

## 2017-02-20 LAB — BASIC METABOLIC PANEL
Anion gap: 13 (ref 5–15)
BUN: 17 mg/dL (ref 6–20)
CHLORIDE: 103 mmol/L (ref 101–111)
CO2: 23 mmol/L (ref 22–32)
Calcium: 10 mg/dL (ref 8.9–10.3)
Creatinine, Ser: 0.77 mg/dL (ref 0.44–1.00)
GFR calc non Af Amer: >60 mL/min (ref >60)
Glucose, Bld: 101 mg/dL — ABNORMAL HIGH (ref 65–99)
POTASSIUM: 3.7 mmol/L (ref 3.5–5.1)
SODIUM: 139 mmol/L (ref 135–145)

## 2017-02-20 LAB — CBC
HEMATOCRIT: 43.4 % (ref 35.0–47.0)
HEMOGLOBIN: 14.5 g/dL (ref 12.0–16.0)
MCH: 32.8 pg (ref 26.0–34.0)
MCHC: 33.5 g/dL (ref 32.0–36.0)
MCV: 97.9 fL (ref 80.0–100.0)
Platelets: 274 10*3/uL (ref 150–440)
RBC: 4.44 MIL/uL (ref 3.80–5.20)
RDW: 13.6 % (ref 11.5–14.5)
WBC: 5.4 10*3/uL (ref 3.6–11.0)

## 2017-02-20 LAB — TROPONIN I: Troponin I: <0.03 ng/mL (ref <0.03)

## 2017-02-20 NOTE — ED Triage Notes (Signed)
Pt had syncopal episode yesterday with fall. Sent today for back pain. Pain seems worse with movement. Pt unable to describe where in back pain is at this time. Hx dementia. Came EMS.  Unable to get further details from pt.

## 2017-02-20 NOTE — ED Notes (Signed)
Pt's son, who is HPO, states he does not want his mother evaluated.  He was not notified by the facility that she was sent by EMS to the hospital.  Patient is in agreeable that she does not want to be evaluated.  Patient will Leave without being seen after triage.

## 2017-02-24 ENCOUNTER — Inpatient Hospital Stay: Payer: Medicare Other | Admitting: Family Medicine

## 2018-01-22 ENCOUNTER — Emergency Department
Admission: EM | Admit: 2018-01-22 | Discharge: 2018-01-22 | Disposition: A | Discharge status: Home / Self Care | Payer: Medicare Other | Attending: Emergency Medicine | Admitting: Emergency Medicine

## 2018-01-22 ENCOUNTER — Emergency Department: Payer: Medicare Other

## 2018-01-22 ENCOUNTER — Other Ambulatory Visit: Payer: Self-pay

## 2018-01-22 ENCOUNTER — Encounter: Payer: Self-pay | Admitting: Medical Oncology

## 2018-01-22 DIAGNOSIS — Z96641 Presence of right artificial hip joint: Secondary | ICD-10-CM | POA: Diagnosis not present

## 2018-01-22 DIAGNOSIS — F0390 Unspecified dementia without behavioral disturbance: Secondary | ICD-10-CM | POA: Insufficient documentation

## 2018-01-22 DIAGNOSIS — W19XXXA Unspecified fall, initial encounter: Secondary | ICD-10-CM | POA: Insufficient documentation

## 2018-01-22 DIAGNOSIS — Y939 Activity, unspecified: Secondary | ICD-10-CM | POA: Diagnosis not present

## 2018-01-22 DIAGNOSIS — S0990XA Unspecified injury of head, initial encounter: Secondary | ICD-10-CM | POA: Insufficient documentation

## 2018-01-22 DIAGNOSIS — Y92129 Unspecified place in nursing home as the place of occurrence of the external cause: Secondary | ICD-10-CM | POA: Insufficient documentation

## 2018-01-22 DIAGNOSIS — Y999 Unspecified external cause status: Secondary | ICD-10-CM | POA: Diagnosis not present

## 2018-01-22 NOTE — Discharge Instructions (Addendum)
CT of the head and cervical spine were negative for acute fractures, bleeding, or other acute abnormalities.

## 2018-01-22 NOTE — ED Notes (Signed)
Pt yelling "I want to go home". Pt sitting at nurses station in stretcher. Side rails up X 2. Pt offered coke-declines and states that we put medicine in her coke.

## 2018-01-22 NOTE — ED Notes (Signed)
Pt resting on stretcher in front of nurses station. No distress at this time. Asking when she will be going home. Awaiting transport by EMS.

## 2018-01-22 NOTE — ED Notes (Signed)
Family at bedside. 

## 2018-01-22 NOTE — ED Notes (Addendum)
EMS called for transport back to facility.   DNR placed in discharge paperwork.  Pt drinking soda provided by RN

## 2018-01-22 NOTE — ED Notes (Signed)
Report given to [place] from [facility].

## 2018-01-22 NOTE — ED Notes (Signed)
Pt given ice cream

## 2018-01-22 NOTE — ED Notes (Signed)
EMS arrived to take pt back to the [facility]. Pt tolerated well.

## 2018-01-22 NOTE — ED Notes (Signed)
Patient transported to CT 

## 2018-01-22 NOTE — ED Notes (Signed)
ED Provider at bedside. 

## 2018-01-22 NOTE — ED Notes (Signed)
Pt calling out while in hall and requesting to go home. Pt is in NAD at this time and is easily reassured by this RN. Pt remains in bed at this time.

## 2018-01-22 NOTE — ED Triage Notes (Addendum)
Pt to ED from the [facility] with reports of unwitnessed fall. Staff thinks pt slipped out of her wheelchair. Has knot to back of head. Pt has dementia. Denies pain at this time. No use of blood thinners.

## 2018-01-22 NOTE — ED Provider Notes (Signed)
[facility] Emergency Department Provider Note ____________________________________________   First MD Initiated Contact with Patient 01/22/18 1711     (approximate)  I have reviewed the triage vital signs and the nursing notes.   HISTORY  Chief Complaint Fall and Head Injury  Level 5 caveat: History of present illness limited due to dementia  HPI Jillian Horn is a 84 y.o. female who presents with apparent head injury after an unwitnessed fall.  Per EMS, the facility staff believe that she slipped out of her wheelchair.  The patient does not remember falling.  She denies any pain currently.  She has no complaints at this time.   Past Medical History:  Diagnosis Date  . Anemia    erosion on EGD  . Dementia (HCC)   . Diverticulosis   . Hiatal hernia   . Hypercholesterolemia   . Osteoporosis    s/p fosamax, s/p forteo, reclast  . Rheumatoid arthritis(714.0)    seropositive, oligo-articular.  MTX, s/p plaquenil, cytopenia on sulfasalazine, s/p remicade    Patient Active Problem List   Diagnosis Date Noted  . Acute cystitis 02/12/2017  . Hip fracture (HCC) 09/15/2015  . Noncompliance with medications 11/08/2014  . Health care maintenance 05/08/2014  . Rheumatoid arthritis (HCC) 08/10/2012  . Anemia 08/10/2012  . Osteoporosis 08/10/2012  . Hypercholesterolemia 08/10/2012  . Dementia (HCC) 08/10/2012  . Hiatal hernia 08/10/2012    Past Surgical History:  Procedure Laterality Date  . ABDOMINAL HYSTERECTOMY     partial  . APPENDECTOMY    . BREAST BIOPSY    . HIP ARTHROPLASTY Right 09/16/2015   Procedure: ARTHROPLASTY BIPOLAR HIP (HEMIARTHROPLASTY);  Surgeon: Christena Flake, MD;  Location: [facility] ORS;  Service: Orthopedics;  Laterality: Right;  . KYPHOPLASTY     thoracic spine  . SKIN CANCER EXCISION      Prior to Admission medications   Medication Sig Start Date End Date Taking? Authorizing Provider  barrier cream (NON-SPECIFIED) CREA Apply  1 application topically 2 (two) times daily as needed.    [provider]  LORazepam (ATIVAN) 0.5 MG tablet Take 1 tablet (0.5 mg total) by mouth every 8 (eight) hours as needed for anxiety or sleep. 02/14/17 02/14/18  Delfino Lovett, MD  Lorazepam POWD Apply 1 mL topically daily as needed.    [provider]  Morphine Sulfate (MORPHINE CONCENTRATE) 10 mg / 0.5 ml concentrated solution Take 0.25 mLs (5 mg total) by mouth every 2 (two) hours as needed for severe pain. 02/14/17   Delfino Lovett, MD  oxyCODONE (OXY IR/ROXICODONE) 5 MG immediate release tablet Take 1 tablet (5 mg total) by mouth every 4 (four) hours as needed for breakthrough pain. Patient not taking: Reported on 02/10/2017 09/18/15   Enedina Finner, MD    Allergies Codeine sulfate; Lidocaine; Sulfa antibiotics; and Sulfasalazine  Family History  Problem Relation Age of Onset  . Heart attack Mother   . Stroke Father   . Cancer Sister     Social History Social History   Tobacco Use  . Smoking status: Never Smoker  . Smokeless tobacco: Never Used  Substance Use Topics  . Alcohol use: No    Alcohol/week: 0.0 standard drinks  . Drug use: No    Review of Systems Level 5 caveat: Unable to obtain review of systems due to dementia   ____________________________________________   PHYSICAL EXAM:  VITAL SIGNS: ED Triage Vitals  Enc Vitals Group     BP 01/22/18 1627 120/66  Pulse Rate 01/22/18 1627 77     Resp 01/22/18 1627 16     Temp 01/22/18 1627 97.7 F (36.5 C)     Temp Source 01/22/18 1627 Oral     SpO2 01/22/18 1627 98 %     Weight 01/22/18 1628 114 lb 10.2 oz (52 kg)     Height --      Head Circumference --      Peak Flow --      Pain Score 01/22/18 1628 0     Pain Loc --      Pain Edu? --      Excl. in GC? --     Constitutional: Alert, comfortable appearing. Eyes: Conjunctivae are normal.  EOMI.  PERRLA. Head: Atraumatic. Nose: No congestion/rhinnorhea. Mouth/Throat: Mucous membranes  are moist.   Neck: Normal range of motion.  No midline cervical spinal tenderness. Cardiovascular:  Good peripheral circulation. Respiratory: Normal respiratory effort.  Chest wall nontender. Gastrointestinal: Soft and nontender. No distention.  Genitourinary: No flank tenderness. Musculoskeletal: No lower extremity edema.  Full range of motion to all extremities.  No midline spinal tenderness.  Extremities warm and well perfused.  Neurologic:  Normal speech and language.  Motor intact in all extremities.  Normal coordination.  No gross focal neurologic deficits are appreciated.  Skin:  Skin is warm and dry. No rash noted. Psychiatric: Calm and cooperative.  ____________________________________________   LABS (all labs ordered are listed, but only abnormal results are displayed)  Labs Reviewed - No data to display ____________________________________________  EKG   ____________________________________________  RADIOLOGY  CT head: No ICH or acute fracture CT cervical spine: No acute fracture or other acute abnormalities  ____________________________________________   PROCEDURES  Procedure(s) performed: No  Procedures  Critical Care performed: No ____________________________________________   INITIAL IMPRESSION / ASSESSMENT AND PLAN / ED COURSE  Pertinent labs & imaging results that were available during my care of the patient were reviewed by me and considered in my medical decision making (see chart for details).  84 year old female with PMH as noted above presents after an unwitnessed fall, apparently from her wheelchair.  EMS noted a small hematoma to the back of her head.  The patient denies any headache, neck pain, or other acute symptoms.  On exam, there is no significant visible trauma.  Neuro exam is nonfocal.  Her vital signs are normal.  Given the patient's age and the inability to obtain an accurate history, will obtain a CT head and cervical spine.  Since  she is otherwise asymptomatic with normal vital signs and a likely mechanical fall, there is no indication for lab work-up at this time.  ----------------------------------------- 6:36 PM on 01/22/2018 -----------------------------------------  Imaging is negative.  The patient continues to be asymptomatic.  She is stable for discharge back to her facility at this time.  Return precautions have been provided in the discharge paperwork. ____________________________________________   FINAL CLINICAL IMPRESSION(S) / ED DIAGNOSES  Final diagnoses:  Minor head injury, initial encounter      NEW MEDICATIONS STARTED DURING THIS VISIT:  New Prescriptions   No medications on file     Note:  This document was prepared using Dragon voice recognition software and may include unintentional dictation errors.    Dionne Bucy, MD 01/22/18 1836

## 2018-02-09 ENCOUNTER — Other Ambulatory Visit: Payer: Self-pay

## 2018-02-09 ENCOUNTER — Inpatient Hospital Stay
Admission: EM | Admit: 2018-02-09 | Discharge: 2018-02-11 | DRG: 481 | Disposition: A | Discharge status: Hospice | Source: Other Acute Inpatient Hospital | Attending: Internal Medicine | Admitting: Internal Medicine

## 2018-02-09 ENCOUNTER — Emergency Department

## 2018-02-09 DIAGNOSIS — Z85828 Personal history of other malignant neoplasm of skin: Secondary | ICD-10-CM | POA: Diagnosis not present

## 2018-02-09 DIAGNOSIS — M810 Age-related osteoporosis without current pathological fracture: Secondary | ICD-10-CM | POA: Diagnosis present

## 2018-02-09 DIAGNOSIS — Z6820 Body mass index (BMI) 20.0-20.9, adult: Secondary | ICD-10-CM

## 2018-02-09 DIAGNOSIS — Z79899 Other long term (current) drug therapy: Secondary | ICD-10-CM | POA: Diagnosis not present

## 2018-02-09 DIAGNOSIS — S72002A Fracture of unspecified part of neck of left femur, initial encounter for closed fracture: Secondary | ICD-10-CM | POA: Diagnosis present

## 2018-02-09 DIAGNOSIS — E870 Hyperosmolality and hypernatremia: Secondary | ICD-10-CM | POA: Diagnosis present

## 2018-02-09 DIAGNOSIS — Z993 Dependence on wheelchair: Secondary | ICD-10-CM

## 2018-02-09 DIAGNOSIS — W19XXXA Unspecified fall, initial encounter: Secondary | ICD-10-CM | POA: Diagnosis present

## 2018-02-09 DIAGNOSIS — K449 Diaphragmatic hernia without obstruction or gangrene: Secondary | ICD-10-CM | POA: Diagnosis present

## 2018-02-09 DIAGNOSIS — Z66 Do not resuscitate: Secondary | ICD-10-CM | POA: Diagnosis present

## 2018-02-09 DIAGNOSIS — S72142A Displaced intertrochanteric fracture of left femur, initial encounter for closed fracture: Secondary | ICD-10-CM | POA: Diagnosis present

## 2018-02-09 DIAGNOSIS — Z8249 Family history of ischemic heart disease and other diseases of the circulatory system: Secondary | ICD-10-CM

## 2018-02-09 DIAGNOSIS — Z823 Family history of stroke: Secondary | ICD-10-CM | POA: Diagnosis not present

## 2018-02-09 DIAGNOSIS — Z90710 Acquired absence of both cervix and uterus: Secondary | ICD-10-CM | POA: Diagnosis not present

## 2018-02-09 DIAGNOSIS — Z885 Allergy status to narcotic agent status: Secondary | ICD-10-CM | POA: Diagnosis not present

## 2018-02-09 DIAGNOSIS — Z96641 Presence of right artificial hip joint: Secondary | ICD-10-CM | POA: Diagnosis present

## 2018-02-09 DIAGNOSIS — F0280 Dementia in other diseases classified elsewhere without behavioral disturbance: Secondary | ICD-10-CM | POA: Diagnosis present

## 2018-02-09 DIAGNOSIS — Z809 Family history of malignant neoplasm, unspecified: Secondary | ICD-10-CM

## 2018-02-09 DIAGNOSIS — E7800 Pure hypercholesterolemia, unspecified: Secondary | ICD-10-CM | POA: Diagnosis present

## 2018-02-09 DIAGNOSIS — Z79891 Long term (current) use of opiate analgesic: Secondary | ICD-10-CM

## 2018-02-09 DIAGNOSIS — M069 Rheumatoid arthritis, unspecified: Secondary | ICD-10-CM | POA: Diagnosis present

## 2018-02-09 DIAGNOSIS — Z888 Allergy status to other drugs, medicaments and biological substances status: Secondary | ICD-10-CM

## 2018-02-09 DIAGNOSIS — Z882 Allergy status to sulfonamides status: Secondary | ICD-10-CM

## 2018-02-09 DIAGNOSIS — R64 Cachexia: Secondary | ICD-10-CM | POA: Diagnosis present

## 2018-02-09 DIAGNOSIS — S72009A Fracture of unspecified part of neck of unspecified femur, initial encounter for closed fracture: Secondary | ICD-10-CM

## 2018-02-09 DIAGNOSIS — G308 Other Alzheimer's disease: Secondary | ICD-10-CM | POA: Diagnosis present

## 2018-02-09 DIAGNOSIS — T148XXA Other injury of unspecified body region, initial encounter: Secondary | ICD-10-CM

## 2018-02-09 LAB — CBC
HCT: 40.8 % (ref 36.0–46.0)
HEMOGLOBIN: 13 g/dL (ref 12.0–15.0)
MCH: 32.5 pg (ref 26.0–34.0)
MCHC: 31.9 g/dL (ref 30.0–36.0)
MCV: 102 fL — AB (ref 80.0–100.0)
NRBC: 0 % (ref 0.0–0.2)
PLATELETS: 218 10*3/uL (ref 150–400)
RBC: 4 MIL/uL (ref 3.87–5.11)
RDW: 12.9 % (ref 11.5–15.5)
WBC: 5.5 10*3/uL (ref 4.0–10.5)

## 2018-02-09 LAB — SURGICAL PCR SCREEN
MRSA, PCR: NEGATIVE
STAPHYLOCOCCUS AUREUS: NEGATIVE

## 2018-02-09 LAB — SAMPLE TO BLOOD BANK

## 2018-02-09 MED ORDER — QUETIAPINE FUMARATE 25 MG PO TABS
25.0000 mg | ORAL_TABLET | Freq: Every day | ORAL | Status: DC | PRN
  Administered 2018-02-10: 25 mg via ORAL
  Filled 2018-02-09: qty 1

## 2018-02-09 MED ORDER — ONDANSETRON HCL 4 MG PO TABS: 4.0000 mg | ORAL_TABLET | Freq: Four times a day (QID) | ORAL | Status: DC | PRN

## 2018-02-09 MED ORDER — ACETAMINOPHEN 325 MG PO TABS: 650.0000 mg | ORAL_TABLET | Freq: Four times a day (QID) | ORAL | Status: DC | PRN

## 2018-02-09 MED ORDER — MORPHINE SULFATE (PF) 2 MG/ML IV SOLN: 0.5000 mg | INTRAVENOUS | Status: DC | PRN

## 2018-02-09 MED ORDER — MORPHINE SULFATE (CONCENTRATE) 10 MG/0.5ML PO SOLN
5.0000 mg | ORAL | Status: DC | PRN
  Administered 2018-02-09 – 2018-02-10 (×4): 5 mg via ORAL
  Filled 2018-02-09 (×4): qty 1

## 2018-02-09 MED ORDER — TRAMADOL HCL 50 MG PO TABS: 50.0000 mg | ORAL_TABLET | Freq: Three times a day (TID) | ORAL | Status: DC

## 2018-02-09 MED ORDER — ONDANSETRON HCL 4 MG/2ML IJ SOLN: 4.0000 mg | Freq: Four times a day (QID) | INTRAMUSCULAR | Status: DC | PRN

## 2018-02-09 MED ORDER — LORAZEPAM 0.5 MG PO TABS
0.5000 mg | ORAL_TABLET | Freq: Three times a day (TID) | ORAL | Status: DC | PRN
  Administered 2018-02-09 – 2018-02-10 (×2): 0.5 mg via ORAL
  Filled 2018-02-09 (×2): qty 1

## 2018-02-09 MED ORDER — MORPHINE SULFATE (PF) 2 MG/ML IV SOLN
1.0000 mg | INTRAVENOUS | Status: DC | PRN
  Administered 2018-02-10: 1 mg via INTRAVENOUS
  Filled 2018-02-09: qty 1

## 2018-02-09 MED ORDER — ACETAMINOPHEN 650 MG RE SUPP: 650.0000 mg | Freq: Four times a day (QID) | RECTAL | Status: DC | PRN

## 2018-02-09 MED ORDER — DEXTROSE 5 % IV SOLN
INTRAVENOUS | Status: DC
  Administered 2018-02-09 – 2018-02-10 (×2): via INTRAVENOUS

## 2018-02-09 NOTE — ED Notes (Signed)
ED TO INPATIENT HANDOFF REPORT  Name/Age/Gender Jillian Horn 84 y.o. female  Code Status    Code Status Orders  (From admission, onward)         Start     Ordered   02/09/18 1919  Do not attempt resuscitation (DNR)  Continuous    Question Answer Comment  In the event of cardiac or respiratory ARREST Do not call a "code blue"   In the event of cardiac or respiratory ARREST Do not perform Intubation, CPR, defibrillation or ACLS   In the event of cardiac or respiratory ARREST Use medication by any route, position, wound care, and other measures to relive pain and suffering. May use oxygen, suction and manual treatment of airway obstruction as needed for comfort.   Comments rn may pronounce      02/09/18 1919        Code Status History    Date Active Date Inactive Code Status Order ID Comments User Context   02/12/2017 1614 02/14/2017 1727 DNR [id number]  Nicholes Mango, MD Inpatient   09/15/2015 1702 09/18/2015 1934 Full Code [id number]  Demetrios Loll, MD Inpatient    Advance Directive Documentation     Most Recent Value  Type of Advance Directive  Healthcare Power of Attorney  Pre-existing out of facility DNR order (yellow form or pink MOST form)  -  "MOST" Form in Place?  -      Home/SNF/Other   Chief Complaint fall  Level of Care/Admitting Diagnosis ED Disposition    ED Disposition Condition [place]: [facility] [[id number]]  Level of Care: Med-Surg [16]  Diagnosis: Closed left hip fracture [facility]) [[id number]]  Admitting Physician: Loletha Grayer [[id number]]  Attending Physician: Loletha Grayer [phone]  Estimated length of stay: past midnight tomorrow  Certification:: I certify this patient will need inpatient services for at least 2 midnights  PT Class (Do Not Modify): Inpatient [101]  PT Acc Code (Do Not Modify): Private [1]       Medical History Past Medical History:  Diagnosis Date  . Anemia    erosion on EGD  .  Dementia ([place])   . Diverticulosis   . Hiatal hernia   . Hypercholesterolemia   . Osteoporosis    s/p fosamax, s/p forteo, reclast  . Rheumatoid arthritis(714.0)    seropositive, oligo-articular.  MTX, s/p plaquenil, cytopenia on sulfasalazine, s/p remicade    Allergies Allergies  Allergen Reactions  . Codeine Sulfate Other (See Comments)    Reaction:  Unknown   . Lidocaine Other (See Comments)    Reaction:  Unknown   . Sulfa Antibiotics Other (See Comments)    Reaction:  Unknown   . Sulfasalazine Other (See Comments)    Reaction:  Unknown     IV Location/Drains/Wounds Patient Lines/Drains/Airways Status   Active Line/Drains/Airways    Name:   Placement date:   Placement time:   Site:   Days:   Peripheral IV 02/09/18 Left Antecubital   02/09/18    1806    Antecubital   less than 1          Labs/Imaging Results for orders placed or performed during the hospital encounter of 02/09/18 (from the past 48 hour(s))  CBC     Status: Abnormal   Collection Time: 02/09/18  5:59 PM  Result Value Ref Range   WBC 5.5 4.0 - 10.5 K/uL   RBC 4.00 3.87 - 5.11 MIL/uL   Hemoglobin 13.0 12.0 - 15.0 g/dL  HCT 40.8 36.0 - 46.0 %   MCV 102.0 (H) 80.0 - 100.0 fL   MCH 32.5 26.0 - 34.0 pg   MCHC 31.9 30.0 - 36.0 g/dL   RDW 12.9 11.5 - 15.5 %   Platelets 218 150 - 400 K/uL   nRBC 0.0 0.0 - 0.2 %    Comment: Performed at [facility], [address] [place], [place] [address]  Comprehensive metabolic panel     Status: Abnormal   Collection Time: 02/09/18  5:59 PM  Result Value Ref Range   Sodium 152 (H) 135 - 145 mmol/L   Potassium 4.8 3.5 - 5.1 mmol/L   Chloride 111 98 - 111 mmol/L   CO2 31 22 - 32 mmol/L   Glucose, Bld 108 (H) 70 - 99 mg/dL   BUN 22 8 - 23 mg/dL   Creatinine, Ser 0.82 0.44 - 1.00 mg/dL   Calcium 9.0 8.9 - 10.3 mg/dL   Total Protein 7.3 6.5 - 8.1 g/dL   Albumin 4.1 3.5 - 5.0 g/dL   AST 22 15 - 41 U/L   ALT 13 0 - 44 U/L   Alkaline Phosphatase 48 38 -  126 U/L   Total Bilirubin 0.7 0.3 - 1.2 mg/dL   GFR calc non Af Amer >60 >60 mL/min   GFR calc Af Amer >60 >60 mL/min    Comment: (NOTE) The eGFR has been calculated using the CKD EPI equation. This calculation has not been validated in all clinical situations. eGFR's persistently <60 mL/min signify possible Chronic Kidney Disease.    Anion gap 10 5 - 15    Comment: Performed at [facility], [address] [place], [place] [address]  Sample to Blood Bank     Status: None   Collection Time: 02/09/18  5:59 PM  Result Value Ref Range   Blood Bank Specimen SAMPLE AVAILABLE FOR TESTING    Sample Expiration      02/12/2018 Performed at [facility], [address], [place], [place] [address]    Dg Chest 1 View  Result Date: 02/09/2018 CLINICAL DATA:  Initial evaluation for acute trauma, fall. EXAM: CHEST  1 VIEW COMPARISON:  Prior radiograph from 02/12/2017. FINDINGS: Cardiac and mediastinal silhouettes are stable in size and contour, and remain within normal limits. Aortic atherosclerosis. Large hiatal hernia, similar to previous. Lungs normally inflated. No focal infiltrates, edema, or effusion. No pneumothorax. No acute osseous abnormality.  Osteopenia. IMPRESSION: 1. No active cardiopulmonary disease. 2. Large hiatal hernia. 3. Aortic atherosclerosis. Electronically Signed   By: Jeannine Boga M.D.   On: 02/09/2018 18:48   Dg Hip Unilat With Pelvis 2-3 Views Left  Result Date: 02/09/2018 CLINICAL DATA:  Initial evaluation for acute trauma, fall. EXAM: DG HIP (WITH OR WITHOUT PELVIS) 2-3V LEFT COMPARISON:  Prior radiograph from 02/12/2017. FINDINGS: Acute comminuted intertrochanteric fracture of the left hip with mild superior subluxation and displacement. Bony pelvis intact. Right total arthroplasty in place without obvious abnormality. Osteopenia. IMPRESSION: Acute comminuted intertrochanteric left hip fracture. Electronically Signed   By: Jeannine Boga  M.D.   On: 02/09/2018 18:47    Pending Labs Unresulted Labs (From admission, onward)    Start     Ordered   02/10/18 0149  Basic metabolic panel  Tomorrow morning,   STAT     02/09/18 1919   02/10/18 0500  CBC  Tomorrow morning,   STAT     02/09/18 1919  Vitals/Pain Today's Vitals   02/09/18 1756 02/09/18 1757 02/09/18 1801  BP: 134/64    Pulse: 99    Temp: (!) 97.5 F (36.4 C)  97.8 F (36.6 C)  TempSrc: Oral  Oral  SpO2: 99%    Weight:  52 kg   PainSc:  4      Isolation Precautions No active isolations  Medications Medications  acetaminophen (TYLENOL) tablet 650 mg (has no administration in time range)    Or  acetaminophen (TYLENOL) suppository 650 mg (has no administration in time range)  ondansetron (ZOFRAN) tablet 4 mg (has no administration in time range)    Or  ondansetron (ZOFRAN) injection 4 mg (has no administration in time range)  morphine CONCENTRATE 10 mg / 0.5 ml oral solution 5 mg (has no administration in time range)  traMADol (ULTRAM) tablet 50 mg (has no administration in time range)  LORazepam (ATIVAN) tablet 0.5 mg (has no administration in time range)  QUEtiapine (SEROQUEL) tablet 25 mg (has no administration in time range)  morphine 2 MG/ML injection 1 mg (has no administration in time range)  dextrose 5 % solution (has no administration in time range)

## 2018-02-09 NOTE — H&P (Signed)
[facility] - [place] at [facility]   PATIENT NAME: Jillian Horn    MR#:  [id number]  DATE OF BIRTH:  Nov 26, 1929  DATE OF ADMISSION:  02/09/2018  PRIMARY CARE PHYSICIAN: Dale Beatrice, MD   REQUESTING/REFERRING PHYSICIAN: Dr. Jene Every  CHIEF COMPLAINT:  Hip fracture  HISTORY OF PRESENT ILLNESS:  Jillian Horn  is a 84 y.o. female with a known history of dementia.  She is followed by hospice at the [facility].  Patient does not recall what happened but apparently had a fall.  She is not that mobile to start with for the past 2-1/2-years she basically pivots to get to the commode.  In the ER she was found to have a left hip fracture.  Hospitalist services contacted for further evaluation.  PAST MEDICAL HISTORY:   Past Medical History:  Diagnosis Date  . Anemia    erosion on EGD  . Dementia (HCC)   . Diverticulosis   . Hiatal hernia   . Hypercholesterolemia   . Osteoporosis    s/p fosamax, s/p forteo, reclast  . Rheumatoid arthritis(714.0)    seropositive, oligo-articular.  MTX, s/p plaquenil, cytopenia on sulfasalazine, s/p remicade    PAST SURGICAL HISTORY:   Past Surgical History:  Procedure Laterality Date  . ABDOMINAL HYSTERECTOMY     partial  . APPENDECTOMY    . BREAST BIOPSY    . HIP ARTHROPLASTY Right 09/16/2015   Procedure: ARTHROPLASTY BIPOLAR HIP (HEMIARTHROPLASTY);  Surgeon: Christena Flake, MD;  Location: [facility] ORS;  Service: Orthopedics;  Laterality: Right;  . KYPHOPLASTY     thoracic spine  . SKIN CANCER EXCISION      SOCIAL HISTORY:   Social History   Tobacco Use  . Smoking status: Never Smoker  . Smokeless tobacco: Never Used  Substance Use Topics  . Alcohol use: No    Alcohol/week: 0.0 standard drinks    FAMILY HISTORY:   Family History  Problem Relation Age of Onset  . Heart attack Mother   . Stroke Father   . Cancer Sister     DRUG ALLERGIES:   Allergies  Allergen Reactions  . Codeine  Sulfate Other (See Comments)    Reaction:  Unknown   . Lidocaine Other (See Comments)    Reaction:  Unknown   . Sulfa Antibiotics Other (See Comments)    Reaction:  Unknown   . Sulfasalazine Other (See Comments)    Reaction:  Unknown     REVIEW OF SYSTEMS:  Limited with dementia   RESPIRATORY: No shortness of breath. CARDIOVASCULAR: No chest pain.  GASTROINTESTINAL: No abdominal pain MUSCULOSKELETAL: Left hip pain.     MEDICATIONS AT HOME:   Prior to Admission medications   Medication Sig Start Date End Date Taking? Authorizing Provider  barrier cream (NON-SPECIFIED) CREA Apply 1 application topically as needed (skin protection after tolieting).   Yes [provider]  LORazepam (ATIVAN) 0.5 MG tablet Take 0.5 mg by mouth every 8 (eight) hours as needed for anxiety or sleep.   Yes [provider]  menthol-zinc oxide (GOLD BOND) powder Apply 1 application topically daily as needed (skin irritation).   Yes [provider]  Morphine Sulfate (MORPHINE CONCENTRATE) 10 mg / 0.5 ml concentrated solution Take 5 mg by mouth every hour as needed for severe pain.   Yes [provider]  NON FORMULARY Apply 0.5-1 mg topically daily as needed (agitation). Lorazepam topical gel (0.5MG /ML)   Yes [provider]  pyrithione zinc (HEAD  AND SHOULDERS) 1 % shampoo Apply 1 application topically daily as needed for itching.   Yes [provider]  QUEtiapine (SEROQUEL) 25 MG tablet Take 25 mg by mouth daily as needed (agitation).   Yes [provider]  traMADol (ULTRAM) 50 MG tablet Take 50 mg by mouth 3 (three) times daily.   Yes [provider]      VITAL SIGNS:  Blood pressure 134/64, pulse 99, temperature 97.8 F (36.6 C), temperature source Oral, weight 52 kg, SpO2 99 %.  PHYSICAL EXAMINATION:  GENERAL:  84 y.o.-year-old patient lying in the bed with no acute distress.  EYES: Pupils equal, round, reactive to light and  accommodation. No scleral icterus. Extraocular muscles intact.  HEENT: Head atraumatic, normocephalic. Oropharynx and nasopharynx clear.  NECK:  Supple, no jugular venous distention. No thyroid enlargement, no tenderness.  LUNGS: Normal breath sounds bilaterally, no wheezing, rales,rhonchi or crepitation. No use of accessory muscles of respiration.  CARDIOVASCULAR: S1, S2 normal. No murmurs, rubs, or gallops.  ABDOMEN: Soft, nontender, nondistended. Bowel sounds present. No organomegaly or mass.  EXTREMITIES: No pedal edema, cyanosis, or clubbing.  NEUROLOGIC: Cranial nerves II through XII are intact. Muscle strength 5/5 in all extremities. Sensation intact. Gait not checked.  PSYCHIATRIC: The patient is alert and oriented x 3.  SKIN: No rash, lesion, or ulcer.   LABORATORY PANEL:   CBC Recent Labs  Lab 02/09/18 1759  WBC 5.5  HGB 13.0  HCT 40.8  PLT 218   ------------------------------------------------------------------------------------------------------------------  Chemistries  No results for input(s): NA, K, CL, CO2, GLUCOSE, BUN, CREATININE, CALCIUM, MG, AST, ALT, ALKPHOS, BILITOT in the last 168 hours.  Invalid input(s): GFRCGP ------------------------------------------------------------------------------------------------------------------  Cardiac Enzymes No results for input(s): TROPONINI in the last 168 hours. ------------------------------------------------------------------------------------------------------------------  RADIOLOGY:  Dg Chest 1 View  Result Date: 02/09/2018 CLINICAL DATA:  Initial evaluation for acute trauma, fall. EXAM: CHEST  1 VIEW COMPARISON:  Prior radiograph from 02/12/2017. FINDINGS: Cardiac and mediastinal silhouettes are stable in size and contour, and remain within normal limits. Aortic atherosclerosis. Large hiatal hernia, similar to previous. Lungs normally inflated. No focal infiltrates, edema, or effusion. No pneumothorax. No acute  osseous abnormality.  Osteopenia. IMPRESSION: 1. No active cardiopulmonary disease. 2. Large hiatal hernia. 3. Aortic atherosclerosis. Electronically Signed   By: Rise Mu M.D.   On: 02/09/2018 18:48   Dg Hip Unilat With Pelvis 2-3 Views Left  Result Date: 02/09/2018 CLINICAL DATA:  Initial evaluation for acute trauma, fall. EXAM: DG HIP (WITH OR WITHOUT PELVIS) 2-3V LEFT COMPARISON:  Prior radiograph from 02/12/2017. FINDINGS: Acute comminuted intertrochanteric fracture of the left hip with mild superior subluxation and displacement. Bony pelvis intact. Right total arthroplasty in place without obvious abnormality. Osteopenia. IMPRESSION: Acute comminuted intertrochanteric left hip fracture. Electronically Signed   By: Rise Mu M.D.   On: 02/09/2018 18:47    EKG:   Normal sinus rhythm 73 bpm no acute ST-T wave changes  IMPRESSION AND PLAN:   1.  Preoperative consultation for left hip fracture closed initial encounter.  No contraindications to surgery at this time if family and surgeon agreed to procedure.  The patient is followed by hospice.  Mortality within the first year of hip fracture is very high especially if they do not get up and walk.  Pain control with tramadol standing dose, Roxanol as needed, morphine IV as needed.  ER physician spoke with Dr. Ernest Pine from orthopedic surgery to see the patient tomorrow morning and discussed with the family options.  2.  Dementia.  Followed by hospice.  PRN Seroquel. 3.  Hypernatremia from poor appetite.  Hydrate with D5W. 4.  History of osteoporosis 5.  History of rheumatoid arthritis   All the records are reviewed and case discussed with ED provider. Management plans discussed with the patient, family and they are in agreement.  CODE STATUS: DNR  TOTAL TIME TAKING CARE OF THIS PATIENT: 50 minutes, including acp time.    Alford Highland M.D on 02/09/2018 at 7:23 PM  Between 7am to 6pm - Pager - [phone]  After  6pm call admission pager [phone]  [facility]  [phone]  CC: Primary care physician; [place], MD

## 2018-02-09 NOTE — ED Triage Notes (Signed)
Pt arrives via [facility] from [place]. Pt had an unwitnessed fall in bathroom. PT has noticeable rotation Left foot. Pt is A&ox2

## 2018-02-09 NOTE — Progress Notes (Signed)
Patient ID: Jillian Horn, female   DOB: 1929-05-19, 84 y.o.   MRN: [id number]  ACP note  Patient unable to participate in conversation.  3 family members present.  Diagnosis: Left hip fracture, dementia, osteoporosis, history of rheumatoid arthritis.  CODE STATUS discussed and she is a DO NOT RESUSCITATE  Plan.  Mortality high within the first year of hip fracture.  Orthopedic surgery to discuss surgical options versus conservative management options with the family tomorrow.  Will make n.p.o. after midnight.  Sometimes we do operations on people that have dementia and do not walk very much in order to help control pain because postoperative pain is less than the fracture pain.  Time spent on ACP discussion 17 minutes Dr. Alford Highland

## 2018-02-09 NOTE — ED Provider Notes (Signed)
[facility] Emergency Department Provider Note ____________________________________________  Time seen: Approximately 6:42 PM  I have reviewed the triage vital signs and the nursing notes.   HISTORY  Chief Complaint No chief complaint on file.    HPI Jillian Horn is a 84 y.o. female who presents to the emergency department for evaluation and treatment after an unwitnessed fall. She is wheelchair bound, but somehow got into the bathroom and fell. It is unclear if the fall was from the wheelchair or she actually got up and tried to walk. Obvious shortening and rotation of the left leg. No obvious head trauma.   Past Medical History:  Diagnosis Date  . Anemia    erosion on EGD  . Dementia (HCC)   . Diverticulosis   . Hiatal hernia   . Hypercholesterolemia   . Osteoporosis    s/p fosamax, s/p forteo, reclast  . Rheumatoid arthritis(714.0)    seropositive, oligo-articular.  MTX, s/p plaquenil, cytopenia on sulfasalazine, s/p remicade    Patient Active Problem List   Diagnosis Date Noted  . Closed left hip fracture (HCC) 02/09/2018  . Acute cystitis 02/12/2017  . Hip fracture (HCC) 09/15/2015  . Noncompliance with medications 11/08/2014  . Health care maintenance 05/08/2014  . Rheumatoid arthritis (HCC) 08/10/2012  . Anemia 08/10/2012  . Osteoporosis 08/10/2012  . Hypercholesterolemia 08/10/2012  . Dementia (HCC) 08/10/2012  . Hiatal hernia 08/10/2012    Past Surgical History:  Procedure Laterality Date  . ABDOMINAL HYSTERECTOMY     partial  . APPENDECTOMY    . BREAST BIOPSY    . HIP ARTHROPLASTY Right 09/16/2015   Procedure: ARTHROPLASTY BIPOLAR HIP (HEMIARTHROPLASTY);  Surgeon: Christena Flake, MD;  Location: [facility] ORS;  Service: Orthopedics;  Laterality: Right;  . KYPHOPLASTY     thoracic spine  . SKIN CANCER EXCISION      Prior to Admission medications   Medication Sig Start Date End Date Taking? Authorizing Provider  barrier cream  (NON-SPECIFIED) CREA Apply 1 application topically as needed (skin protection after tolieting).   Yes [provider]  LORazepam (ATIVAN) 0.5 MG tablet Take 0.5 mg by mouth every 8 (eight) hours as needed for anxiety or sleep.   Yes [provider]  menthol-zinc oxide (GOLD BOND) powder Apply 1 application topically daily as needed (skin irritation).   Yes [provider]  Morphine Sulfate (MORPHINE CONCENTRATE) 10 mg / 0.5 ml concentrated solution Take 5 mg by mouth every hour as needed for severe pain.   Yes [provider]  NON FORMULARY Apply 0.5-1 mg topically daily as needed (agitation). Lorazepam topical gel (0.5MG /ML)   Yes [provider]  pyrithione zinc (HEAD AND SHOULDERS) 1 % shampoo Apply 1 application topically daily as needed for itching.   Yes [provider]  QUEtiapine (SEROQUEL) 25 MG tablet Take 25 mg by mouth daily as needed (agitation).   Yes [provider]  traMADol (ULTRAM) 50 MG tablet Take 50 mg by mouth 3 (three) times daily.   Yes [provider]    Allergies Codeine sulfate; Lidocaine; Sulfa antibiotics; and Sulfasalazine  Family History  Problem Relation Age of Onset  . Heart attack Mother   . Stroke Father   . Cancer Sister     Social History Social History   Tobacco Use  . Smoking status: Never Smoker  . Smokeless tobacco: Never Used  Substance Use Topics  . Alcohol use: No    Alcohol/week: 0.0 standard drinks  . Drug  use: No    Review of Systems  Level 5 Caveat: end stage alzheimer's  ____________________________________________   PHYSICAL EXAM:  VITAL SIGNS: ED Triage Vitals  Enc Vitals Group     BP 02/09/18 1756 134/64     Pulse Rate 02/09/18 1756 99     Resp --      Temp 02/09/18 1756 (!) 97.5 F (36.4 C)     Temp Source 02/09/18 1756 Oral     SpO2 02/09/18 1756 99 %     Weight 02/09/18 1757 114 lb 10.2 oz (52 kg)     Height --      Head Circumference --       Peak Flow --      Pain Score 02/09/18 1757 4     Pain Loc --      Pain Edu? --      Excl. in GC? --     Constitutional: Demented Eyes: Conjunctivae are clear Head: Atraumatic on exam Neck: Supple Respiratory: No cough. Respirations are even and unlabored. Musculoskeletal: Rotation and shortening of the left leg. Neurologic: At baseline per family  Skin: No open wounds noted.  Psychiatric: Affect and behavior are appropriate.  ____________________________________________   LABS (all labs ordered are listed, but only abnormal results are displayed)  Labs Reviewed  CBC - Abnormal; Notable for the following components:      Result Value   MCV 102.0 (*)    All other components within normal limits  COMPREHENSIVE METABOLIC PANEL - Abnormal; Notable for the following components:   Sodium 152 (*)    Glucose, Bld 108 (*)    All other components within normal limits  BASIC METABOLIC PANEL  CBC  SAMPLE TO BLOOD BANK   ____________________________________________  RADIOLOGY  Acute comminuted intertrochanteric left hip fracture. ____________________________________________   PROCEDURES  Procedures  ____________________________________________   INITIAL IMPRESSION / ASSESSMENT AND PLAN / ED COURSE  Jillian Horn is a 84 y.o. who presents to the emergency department for treatment and evaluation after an unwitnessed fall. X-ray of the left hip shows comminuted hip fracture. Dr. Ernest Pine was consulted. She will be admitted by hospitalist and ortho will see her tomorrow. Family are agreeable with the plan.  Medications  acetaminophen (TYLENOL) tablet 650 mg (has no administration in time range)    Or  acetaminophen (TYLENOL) suppository 650 mg (has no administration in time range)  ondansetron (ZOFRAN) tablet 4 mg (has no administration in time range)    Or  ondansetron (ZOFRAN) injection 4 mg (has no administration in time range)  morphine CONCENTRATE 10 mg / 0.5 ml  oral solution 5 mg (has no administration in time range)  traMADol (ULTRAM) tablet 50 mg (has no administration in time range)  LORazepam (ATIVAN) tablet 0.5 mg (has no administration in time range)  QUEtiapine (SEROQUEL) tablet 25 mg (has no administration in time range)  morphine 2 MG/ML injection 1 mg (has no administration in time range)  dextrose 5 % solution (has no administration in time range)    Pertinent labs & imaging results that were available during my care of the patient were reviewed by me and considered in my medical decision making (see chart for details).  _________________________________________   FINAL CLINICAL IMPRESSION(S) / ED DIAGNOSES  Final diagnoses:  Closed fracture of left hip, initial encounter [facility])    ED Discharge Orders    None       If controlled substance prescribed during this visit, 12 month history viewed on  the [facility] prior to issuing an initial prescription for Schedule II or III opiod.    Chinita Pester, FNP 02/09/18 2021    Jene Every, MD 02/09/18 2028

## 2018-02-10 ENCOUNTER — Inpatient Hospital Stay: Admitting: Anesthesiology

## 2018-02-10 ENCOUNTER — Encounter: Admission: EM | Disposition: A | Discharge status: Hospice | Payer: Self-pay | Attending: Internal Medicine

## 2018-02-10 ENCOUNTER — Inpatient Hospital Stay

## 2018-02-10 HISTORY — PX: INTRAMEDULLARY (IM) NAIL INTERTROCHANTERIC: SHX5875

## 2018-02-10 LAB — CBC
HCT: 29.8 % — ABNORMAL LOW (ref 36.0–46.0)
Hemoglobin: 9.6 g/dL — ABNORMAL LOW (ref 12.0–15.0)
MCH: 33.4 pg (ref 26.0–34.0)
MCHC: 32.2 g/dL (ref 30.0–36.0)
MCV: 103.8 fL — AB (ref 80.0–100.0)
NRBC: 0 % (ref 0.0–0.2)
Platelets: 158 10*3/uL (ref 150–400)
RBC: 2.87 MIL/uL — ABNORMAL LOW (ref 3.87–5.11)
RDW: 12.9 % (ref 11.5–15.5)
WBC: 8 10*3/uL (ref 4.0–10.5)

## 2018-02-10 LAB — COMPREHENSIVE METABOLIC PANEL
ALK PHOS: 48 U/L (ref 38–126)
ALT: 13 U/L (ref 0–44)
AST: 22 U/L (ref 15–41)
Albumin: 4.1 g/dL (ref 3.5–5.0)
Anion gap: 2 — ABNORMAL LOW (ref 5–15)
BUN: 22 mg/dL (ref 8–23)
CHLORIDE: 111 mmol/L (ref 98–111)
CO2: 31 mmol/L (ref 22–32)
CREATININE: 0.82 mg/dL (ref 0.44–1.00)
Calcium: 9 mg/dL (ref 8.9–10.3)
GFR calc Af Amer: >60 mL/min (ref >60)
Glucose, Bld: 108 mg/dL — ABNORMAL HIGH (ref 70–99)
Potassium: 4.8 mmol/L (ref 3.5–5.1)
Sodium: 144 mmol/L (ref 135–145)
Total Bilirubin: 0.7 mg/dL (ref 0.3–1.2)
Total Protein: 7.3 g/dL (ref 6.5–8.1)

## 2018-02-10 LAB — BASIC METABOLIC PANEL
Anion gap: 6 (ref 5–15)
BUN: 22 mg/dL (ref 8–23)
CO2: 28 mmol/L (ref 22–32)
CREATININE: 0.83 mg/dL (ref 0.44–1.00)
Calcium: 8.9 mg/dL (ref 8.9–10.3)
Chloride: 107 mmol/L (ref 98–111)
GFR calc non Af Amer: >60 mL/min (ref >60)
Glucose, Bld: 132 mg/dL — ABNORMAL HIGH (ref 70–99)
Potassium: 4.1 mmol/L (ref 3.5–5.1)
SODIUM: 141 mmol/L (ref 135–145)

## 2018-02-10 SURGERY — FIXATION, FRACTURE, INTERTROCHANTERIC, WITH INTRAMEDULLARY ROD
Anesthesia: Spinal | Site: Hip | Laterality: Left

## 2018-02-10 MED ORDER — KETAMINE HCL 50 MG/ML IJ SOLN
INTRAMUSCULAR | Status: AC
  Filled 2018-02-10: qty 10

## 2018-02-10 MED ORDER — TRAMADOL HCL 50 MG PO TABS
50.0000 mg | ORAL_TABLET | Freq: Four times a day (QID) | ORAL | Status: DC | PRN
  Administered 2018-02-11: 50 mg via ORAL
  Filled 2018-02-10: qty 1

## 2018-02-10 MED ORDER — LACTATED RINGERS IV SOLN
INTRAVENOUS | Status: DC
  Administered 2018-02-10 (×2): via INTRAVENOUS

## 2018-02-10 MED ORDER — MENTHOL 3 MG MT LOZG
1.0000 | LOZENGE | OROMUCOSAL | Status: DC | PRN
  Filled 2018-02-10: qty 9

## 2018-02-10 MED ORDER — FENTANYL CITRATE (PF) 100 MCG/2ML IJ SOLN
INTRAMUSCULAR | Status: AC
  Filled 2018-02-10: qty 2

## 2018-02-10 MED ORDER — FENTANYL CITRATE (PF) 100 MCG/2ML IJ SOLN
INTRAMUSCULAR | Status: DC | PRN
  Administered 2018-02-10: 50 ug via INTRAVENOUS

## 2018-02-10 MED ORDER — OXYCODONE HCL 5 MG PO TABS: 10.0000 mg | ORAL_TABLET | ORAL | Status: DC | PRN

## 2018-02-10 MED ORDER — FENTANYL CITRATE (PF) 100 MCG/2ML IJ SOLN: 25.0000 ug | INTRAMUSCULAR | Status: DC | PRN

## 2018-02-10 MED ORDER — OXYCODONE HCL 5 MG PO TABS: 5.0000 mg | ORAL_TABLET | ORAL | Status: DC | PRN

## 2018-02-10 MED ORDER — SENNOSIDES-DOCUSATE SODIUM 8.6-50 MG PO TABS
1.0000 | ORAL_TABLET | Freq: Two times a day (BID) | ORAL | Status: DC
  Administered 2018-02-11: 1 via ORAL
  Filled 2018-02-10: qty 1

## 2018-02-10 MED ORDER — ACETAMINOPHEN 10 MG/ML IV SOLN
1000.0000 mg | Freq: Four times a day (QID) | INTRAVENOUS | Status: DC
  Administered 2018-02-10 – 2018-02-11 (×3): 1000 mg via INTRAVENOUS
  Filled 2018-02-10 (×4): qty 100

## 2018-02-10 MED ORDER — ACETAMINOPHEN 10 MG/ML IV SOLN
1000.0000 mg | Freq: Once | INTRAVENOUS | Status: AC
  Administered 2018-02-10: 1000 mg via INTRAVENOUS
  Filled 2018-02-10: qty 100

## 2018-02-10 MED ORDER — MAGNESIUM HYDROXIDE 400 MG/5ML PO SUSP
30.0000 mL | Freq: Every day | ORAL | Status: DC | PRN
  Administered 2018-02-11: 30 mL via ORAL
  Filled 2018-02-10: qty 30

## 2018-02-10 MED ORDER — ACETAMINOPHEN 325 MG PO TABS: 325.0000 mg | ORAL_TABLET | Freq: Four times a day (QID) | ORAL | Status: DC | PRN

## 2018-02-10 MED ORDER — HYDROMORPHONE HCL 1 MG/ML IJ SOLN: 0.5000 mg | INTRAMUSCULAR | Status: DC | PRN

## 2018-02-10 MED ORDER — PROPOFOL 10 MG/ML IV BOLUS
INTRAVENOUS | Status: AC
  Filled 2018-02-10: qty 40

## 2018-02-10 MED ORDER — KETAMINE HCL 50 MG/ML IJ SOLN
INTRAMUSCULAR | Status: DC | PRN
  Administered 2018-02-10 (×2): 10 mg via INTRAMUSCULAR

## 2018-02-10 MED ORDER — PANTOPRAZOLE SODIUM 40 MG PO TBEC
40.0000 mg | DELAYED_RELEASE_TABLET | Freq: Two times a day (BID) | ORAL | Status: DC
  Administered 2018-02-11: 40 mg via ORAL
  Filled 2018-02-10: qty 1

## 2018-02-10 MED ORDER — METOCLOPRAMIDE HCL 5 MG/ML IJ SOLN: 5.0000 mg | Freq: Three times a day (TID) | INTRAMUSCULAR | Status: DC | PRN

## 2018-02-10 MED ORDER — ONDANSETRON HCL 4 MG/2ML IJ SOLN: 4.0000 mg | Freq: Once | INTRAMUSCULAR | Status: DC | PRN

## 2018-02-10 MED ORDER — FERROUS SULFATE 325 (65 FE) MG PO TABS
325.0000 mg | ORAL_TABLET | Freq: Two times a day (BID) | ORAL | Status: DC
  Administered 2018-02-11: 325 mg via ORAL
  Filled 2018-02-10: qty 1

## 2018-02-10 MED ORDER — PHENYLEPHRINE HCL 10 MG/ML IJ SOLN
INTRAMUSCULAR | Status: DC | PRN
  Administered 2018-02-10 (×7): 100 ug via INTRAVENOUS

## 2018-02-10 MED ORDER — CEFAZOLIN SODIUM-DEXTROSE 2-4 GM/100ML-% IV SOLN
2.0000 g | Freq: Four times a day (QID) | INTRAVENOUS | Status: DC
  Administered 2018-02-11 (×3): 2 g via INTRAVENOUS
  Filled 2018-02-10 (×4): qty 100

## 2018-02-10 MED ORDER — ONDANSETRON HCL 4 MG PO TABS: 4.0000 mg | ORAL_TABLET | Freq: Four times a day (QID) | ORAL | Status: DC | PRN

## 2018-02-10 MED ORDER — SODIUM CHLORIDE 0.9 % IV SOLN
INTRAVENOUS | Status: DC
  Administered 2018-02-10: 22:00:00 via INTRAVENOUS

## 2018-02-10 MED ORDER — EPHEDRINE SULFATE 50 MG/ML IJ SOLN
INTRAMUSCULAR | Status: DC | PRN
  Administered 2018-02-10 (×2): 10 mg via INTRAVENOUS

## 2018-02-10 MED ORDER — CEFAZOLIN SODIUM-DEXTROSE 2-4 GM/100ML-% IV SOLN
2.0000 g | INTRAVENOUS | Status: AC
  Administered 2018-02-10: 2 g via INTRAVENOUS
  Filled 2018-02-10: qty 100

## 2018-02-10 MED ORDER — BISACODYL 10 MG RE SUPP
10.0000 mg | Freq: Every day | RECTAL | Status: DC | PRN
  Administered 2018-02-11: 10 mg via RECTAL
  Filled 2018-02-10: qty 1

## 2018-02-10 MED ORDER — PROPOFOL 500 MG/50ML IV EMUL
INTRAVENOUS | Status: DC | PRN
  Administered 2018-02-10: 20 ug/kg/min via INTRAVENOUS

## 2018-02-10 MED ORDER — ONDANSETRON HCL 4 MG/2ML IJ SOLN: 4.0000 mg | Freq: Four times a day (QID) | INTRAMUSCULAR | Status: DC | PRN

## 2018-02-10 MED ORDER — ENOXAPARIN SODIUM 40 MG/0.4ML ~~LOC~~ SOLN
40.0000 mg | SUBCUTANEOUS | Status: DC
  Administered 2018-02-11: 40 mg via SUBCUTANEOUS
  Filled 2018-02-10: qty 0.4

## 2018-02-10 MED ORDER — PHENOL 1.4 % MT LIQD
1.0000 | OROMUCOSAL | Status: DC | PRN
  Filled 2018-02-10: qty 177

## 2018-02-10 MED ORDER — METOCLOPRAMIDE HCL 10 MG PO TABS: 5.0000 mg | ORAL_TABLET | Freq: Three times a day (TID) | ORAL | Status: DC | PRN

## 2018-02-10 MED ORDER — NEOMYCIN-POLYMYXIN B GU 40-200000 IR SOLN
Status: DC | PRN
  Administered 2018-02-10: 4 mL

## 2018-02-10 MED ORDER — SODIUM CHLORIDE 0.9 % IV SOLN
INTRAVENOUS | Status: DC | PRN
  Administered 2018-02-10: 100 ug/min via INTRAVENOUS

## 2018-02-10 MED ORDER — FLEET ENEMA 7-19 GM/118ML RE ENEM: 1.0000 | ENEMA | Freq: Once | RECTAL | Status: DC | PRN

## 2018-02-10 SURGICAL SUPPLY — 38 items
BIT DRILL FLUTED FEMUR 4.2/3 (BIT) ×2 IMPLANT
BLADE 85 HELICAL TFNA (Anchor) ×2 IMPLANT
CANISTER SUCT 1200ML W/VALVE (MISCELLANEOUS) ×2 IMPLANT
COVER WAND RF STERILE (DRAPES) IMPLANT
DRAPE C-ARMOR (DRAPES) IMPLANT
DRAPE SHEET LG 3/4 BI-LAMINATE (DRAPES) IMPLANT
DRSG DERMACEA 8X12 NADH (GAUZE/BANDAGES/DRESSINGS) IMPLANT
DRSG OPSITE POSTOP 3X4 (GAUZE/BANDAGES/DRESSINGS) ×2 IMPLANT
DRSG OPSITE POSTOP 4X12 (GAUZE/BANDAGES/DRESSINGS) ×2 IMPLANT
DRSG OPSITE POSTOP 4X6 (GAUZE/BANDAGES/DRESSINGS) ×2 IMPLANT
DURAPREP 26ML APPLICATOR (WOUND CARE) ×2 IMPLANT
ELECT REM PT RETURN 9FT ADLT (ELECTROSURGICAL) ×2
ELECTRODE REM PT RTRN 9FT ADLT (ELECTROSURGICAL) ×1 IMPLANT
GAUZE SPONGE 4X4 12PLY STRL (GAUZE/BANDAGES/DRESSINGS) ×2 IMPLANT
GLOVE BIOGEL M STRL SZ7.5 (GLOVE) ×6 IMPLANT
GLOVE INDICATOR 8.0 STRL GRN (GLOVE) ×6 IMPLANT
GOWN STRL REUS W/ TWL LRG LVL3 (GOWN DISPOSABLE) ×3 IMPLANT
GOWN STRL REUS W/TWL LRG LVL3 (GOWN DISPOSABLE) ×3
IMPL DEG TI CANN 11MM/130 (Orthopedic Implant) ×1 IMPLANT
IMPLANT DEG TI CANN 11MM/130 (Orthopedic Implant) ×2 IMPLANT
KIT TURNOVER CYSTO (KITS) ×2 IMPLANT
MAT ABSORB  FLUID 56X50 GRAY (MISCELLANEOUS) ×1
MAT ABSORB FLUID 56X50 GRAY (MISCELLANEOUS) ×1 IMPLANT
NS IRRIG 1000ML POUR BTL (IV SOLUTION) ×2 IMPLANT
PACK HIP COMPR (MISCELLANEOUS) ×2 IMPLANT
REAMER ROD DEEP FLUTE 2.5X950 (INSTRUMENTS) ×2 IMPLANT
SCREW LOCK T25 FT 36X5X4.3X (Screw) ×1 IMPLANT
SCREW LOCKING 5.0X36MM (Screw) ×1 IMPLANT
SOL PREP PVP 2OZ (MISCELLANEOUS) ×2
SOLUTION PREP PVP 2OZ (MISCELLANEOUS) ×1 IMPLANT
STAPLER SKIN PROX 35W (STAPLE) ×2 IMPLANT
SUCTION FRAZIER HANDLE 10FR (MISCELLANEOUS) ×1
SUCTION TUBE FRAZIER 10FR DISP (MISCELLANEOUS) ×1 IMPLANT
SUT VIC AB 0 CT1 36 (SUTURE) ×2 IMPLANT
SUT VIC AB 1 CT1 36 (SUTURE) ×2 IMPLANT
SUT VIC AB 2-0 CT1 27 (SUTURE) ×1
SUT VIC AB 2-0 CT1 TAPERPNT 27 (SUTURE) ×1 IMPLANT
TAPE MICROFOAM 4IN (TAPE) IMPLANT

## 2018-02-10 NOTE — Transfer of Care (Signed)
Immediate Anesthesia Transfer of Care Note  Patient: Jillian Horn  Procedure(s) Performed: INTRAMEDULLARY (IM) NAIL INTERTROCHANTRIC (Left Hip)  Patient Location: PACU  Anesthesia Type:spinal  Level of Consciousness: sedated  Airway & Oxygen Therapy: Patient Spontanous Breathing and Patient connected to face mask oxygen  Post-op Assessment: Report given to RN and Post -op Vital signs reviewed and stable  Post vital signs: Reviewed and stable  Last Vitals:  Vitals Value Taken Time  BP 97/63 02/10/2018  8:39 PM  Temp    Pulse 89 02/10/2018  8:41 PM  Resp 12 02/10/2018  8:43 PM  SpO2 100 % 02/10/2018  8:41 PM  Vitals shown include unvalidated device data.  Last Pain:  Vitals:   02/10/18 1037  TempSrc:   PainSc: Asleep      Patients Stated Pain Goal: 1 (02/10/18 1347)  Complications: No apparent anesthesia complications

## 2018-02-10 NOTE — Progress Notes (Signed)
Placed pt on continuous pulse ox per verbal order from MD Allena Katz. Desats on room air to the 60s-70s. 99% on 2L Nasal canula.

## 2018-02-10 NOTE — Consult Note (Signed)
ORTHOPAEDIC CONSULTATION  PATIENT NAME: Jillian Horn DOB: 29-Oct-1929  MRN: [id number]  REQUESTING PHYSICIAN: Enedina Finner, MD  Chief Complaint: Left hip pain  HPI: Jillian Horn is a 84 y.o. female who apparently sustained a fall.  She was noted to have shortening of the left lower extremity and pain with attempted range of motion.  Prior to this episode she was basically pivot transfer.  She is followed by hospice.  Past Medical History:  Diagnosis Date  . Anemia    erosion on EGD  . Dementia (HCC)   . Diverticulosis   . Hiatal hernia   . Hypercholesterolemia   . Osteoporosis    s/p fosamax, s/p forteo, reclast  . Rheumatoid arthritis(714.0)    seropositive, oligo-articular.  MTX, s/p plaquenil, cytopenia on sulfasalazine, s/p remicade   Past Surgical History:  Procedure Laterality Date  . ABDOMINAL HYSTERECTOMY     partial  . APPENDECTOMY    . BREAST BIOPSY    . HIP ARTHROPLASTY Right 09/16/2015   Procedure: ARTHROPLASTY BIPOLAR HIP (HEMIARTHROPLASTY);  Surgeon: Christena Flake, MD;  Location: [facility] ORS;  Service: Orthopedics;  Laterality: Right;  . KYPHOPLASTY     thoracic spine  . SKIN CANCER EXCISION     Social History   Socioeconomic History  . Marital status: Widowed    Spouse name: Not on file  . Number of children: Not on file  . Years of education: Not on file  . Highest education level: Not on file  Occupational History  . Not on file  Social Needs  . Financial resource strain: Not on file  . Food insecurity:    Worry: Not on file    Inability: Not on file  . Transportation needs:    Medical: Not on file    Non-medical: Not on file  Tobacco Use  . Smoking status: Never Smoker  . Smokeless tobacco: Never Used  Substance and Sexual Activity  . Alcohol use: No    Alcohol/week: 0.0 standard drinks  . Drug use: No  . Sexual activity: Not on file  Lifestyle  . Physical activity:    Days per week: Not on file    Minutes per session: Not on file  .  Stress: Not on file  Relationships  . Social connections:    Talks on phone: Not on file    Gets together: Not on file    Attends religious service: Not on file    Active member of club or organization: Not on file    Attends meetings of clubs or organizations: Not on file    Relationship status: Not on file  Other Topics Concern  . Not on file  Social History Narrative  . Not on file   Family History  Problem Relation Age of Onset  . Heart attack Mother   . Stroke Father   . Cancer Sister    Allergies  Allergen Reactions  . Codeine Sulfate Other (See Comments)    Reaction:  Unknown   . Lidocaine Other (See Comments)    Reaction:  Unknown   . Sulfa Antibiotics Other (See Comments)    Reaction:  Unknown   . Sulfasalazine Other (See Comments)    Reaction:  Unknown    Prior to Admission medications   Medication Sig Start Date End Date Taking? Authorizing Provider  barrier cream (NON-SPECIFIED) CREA Apply 1 application topically as needed (skin protection after tolieting).   Yes [provider]  LORazepam (ATIVAN) 0.5 MG tablet Take  0.5 mg by mouth every 8 (eight) hours as needed for anxiety or sleep.   Yes [provider]  menthol-zinc oxide (GOLD BOND) powder Apply 1 application topically daily as needed (skin irritation).   Yes [provider]  Morphine Sulfate (MORPHINE CONCENTRATE) 10 mg / 0.5 ml concentrated solution Take 5 mg by mouth every hour as needed for severe pain.   Yes [provider]  NON FORMULARY Apply 0.5-1 mg topically daily as needed (agitation). Lorazepam topical gel (0.5MG /ML)   Yes [provider]  pyrithione zinc (HEAD AND SHOULDERS) 1 % shampoo Apply 1 application topically daily as needed for itching.   Yes [provider]  QUEtiapine (SEROQUEL) 25 MG tablet Take 25 mg by mouth daily as needed (agitation).   Yes [provider]  traMADol (ULTRAM) 50 MG tablet Take 50 mg by mouth 3 (three)  times daily.   Yes [provider]   Dg Chest 1 View  Result Date: 02/09/2018 CLINICAL DATA:  Initial evaluation for acute trauma, fall. EXAM: CHEST  1 VIEW COMPARISON:  Prior radiograph from 02/12/2017. FINDINGS: Cardiac and mediastinal silhouettes are stable in size and contour, and remain within normal limits. Aortic atherosclerosis. Large hiatal hernia, similar to previous. Lungs normally inflated. No focal infiltrates, edema, or effusion. No pneumothorax. No acute osseous abnormality.  Osteopenia. IMPRESSION: 1. No active cardiopulmonary disease. 2. Large hiatal hernia. 3. Aortic atherosclerosis. Electronically Signed   By: Rise Mu M.D.   On: 02/09/2018 18:48   Dg Hip Unilat With Pelvis 2-3 Views Left  Result Date: 02/09/2018 CLINICAL DATA:  Initial evaluation for acute trauma, fall. EXAM: DG HIP (WITH OR WITHOUT PELVIS) 2-3V LEFT COMPARISON:  Prior radiograph from 02/12/2017. FINDINGS: Acute comminuted intertrochanteric fracture of the left hip with mild superior subluxation and displacement. Bony pelvis intact. Right total arthroplasty in place without obvious abnormality. Osteopenia. IMPRESSION: Acute comminuted intertrochanteric left hip fracture. Electronically Signed   By: Rise Mu M.D.   On: 02/09/2018 18:47    Positive ROS: All other systems have been reviewed and were otherwise negative with the exception of those mentioned in the HPI and as above.  Physical Exam: General: Well developed, well nourished frail-appearing female seen quite agitated.Marland Kitchen HEENT: Atraumatic and normocephalic. Sclera are clear. Extraocular motion is intact. Oropharynx is clear with moist mucosa. Neck: Supple, nontender, good range of motion. No JVD or carotid bruits. Lungs: Clear to auscultation bilaterally. Cardiovascular: Regular rate and rhythm with normal S1 and S2. No murmurs. No gallops or rubs. Pedal pulses are palpable bilaterally. Homans test is negative bilaterally.  No significant pretibial or ankle edema. Abdomen: Soft, nontender, and nondistended. Bowel sounds are present. Skin: No lesions in the area of chief complaint Neurologic: Awake, alert, but not oriented. Sensory function is grossly intact. Motor strength is felt to be 5 over 5 bilaterally. No clonus or tremor. Good motor coordination. Lymphatic: No axillary or cervical lymphadenopathy  MUSCULOSKELETAL: Lower extremity is shortened and rotated.  Pain is elicited with any attempted range of motion of the left hip.  No gross tenderness to palpation about the knee or ankle.  Assessment: Left intertrochanteric femur fracture  Plan: The findings were discussed with the patient's children.  The option of open reduction internal fixation of the hip fracture was discussed.  The usual perioperative course was discussed. The risks and benefits of surgical intervention were reviewed. The patient expressed understanding of the risks and benefits and agreed with plans for surgical intervention.   The  surgical site was signed as per the "right site surgery" protocol.   James P. Angie Fava M.D.

## 2018-02-10 NOTE — Care Management (Signed)
Patient is listed as being from the [place]. RNCM will follow along with CSW.

## 2018-02-10 NOTE — Progress Notes (Signed)
Visit made. Patient is currently followed by [facility] at [facility] with a hospice diagnosis of abnormal weight loss and dementia. She is a DNR code with out of facility DNR in place in the facility. Patient was sent to the [facility] ED following an unwitnessed fall. In the ED xray revealed an "acute comminuted intertrochanteric fracture of the left hip with mild superior subluxation and displacement". Ortho consult pending. Patient seen lying in bed, appeared uncomfortable. She has received 2 previous doses of PRN pain medication and a PRN dose of lorazepam since midnight. Sons Lenna Gilford and Reed at bedside. Along with Recruitment consultant.Sons in agreement with pain medication being given, staff RN Shanda Bumps made aware. PRN liquid morphine to be given. Discharge disposition to be determined. Emotional support given. Will continue to follow and update hospice team. Dayna Barker RN, BSN, [facility] [place], hospital liaison [phone]

## 2018-02-10 NOTE — Anesthesia Preprocedure Evaluation (Signed)
Anesthesia Evaluation  Patient identified by MRN, date of birth, ID band Patient confused    Reviewed: Patient's Chart, lab work & pertinent test results, Unable to perform ROS - Chart review only  History of Anesthesia Complications Negative for: history of anesthetic complications  Airway Mallampati: III       Dental   Pulmonary neg pulmonary ROS,           Cardiovascular negative cardio ROS       Neuro/Psych Dementia    GI/Hepatic Neg liver ROS, hiatal hernia,   Endo/Other  neg diabetes  Renal/GU negative Renal ROS     Musculoskeletal   Abdominal   Peds  Hematology  (+) anemia ,   Anesthesia Other Findings   Reproductive/Obstetrics                             Anesthesia Physical Anesthesia Plan  ASA: III and emergent  Anesthesia Plan: Spinal   Post-op Pain Management:    Induction:   PONV Risk Score and Plan:   Airway Management Planned:   Additional Equipment:   Intra-op Plan:   Post-operative Plan:   Informed Consent: I have reviewed the patients History and Physical, chart, labs and discussed the procedure including the risks, benefits and alternatives for the proposed anesthesia with the patient or authorized representative who has indicated his/her understanding and acceptance.     Plan Discussed with:   Anesthesia Plan Comments: (History from family member at bedside. Reviewed the suspension of DNR while in the OR with family member and he is aware and desires to proceed.)        Anesthesia Quick Evaluation

## 2018-02-10 NOTE — Progress Notes (Signed)
Pt agitated, yelling out, and pulling at objects in room. MD Hooten at bedside. Seroquel given for anxiety. Verbal order for one time dose of IV Tylenol 1000mg  given to this nurse. Will administer when sent up by pharmacy.

## 2018-02-10 NOTE — Clinical Social Work Note (Signed)
Clinical Social Work Assessment  Patient Details  Name: Jillian Horn MRN: [id number] Date of Birth: 1930/03/17  Date of referral:  02/10/18               Reason for consult:  Other (Comment Required)(From [facility] )                Permission sought to share information with:  Facility Art therapist granted to share information::  Yes, Verbal Permission Granted  Name::        Agency::     Relationship::     Contact Information:     Housing/Transportation Living arrangements for the past 2 months:  [place] of Information:  Facility, Adult Children Patient Interpreter Needed:  None Criminal Activity/Legal Involvement Pertinent to Current Situation/Hospitalization:  No - Comment as needed Significant Relationships:  Adult Children Lives with:  Facility Resident Do you feel safe going back to the place where you live?    Need for family participation in patient care:  Yes (Comment)  Care giving concerns:  Patient has been a resident at [facility] for about 2 years (fax: [phone]).    Social Worker assessment / plan:  Holiday representative ([place]) received consult that patient is from [facility] and is open to [facility]. Patient has a hip fracture. Ortho consult is pending. CSW contacted Rachel Bo The Research scientist (medical) to get additional information. Per Rachel Bo patient is on hospice care and has a hospital bed at [facility]. Per Rachel Bo patient does not ambulate at baseline and when she tries to take a few steps she falls. Per Rachel Bo patient can use her feet to propel the wheel chair. Per Rachel Bo patient is on room air at baseline. CSW made Dustin aware that patient has a hip fracture. Per Rachel Bo they can accept patient back to [facility] and continue hospice care.  CSW met with patient's 2 adult sons at bedside Sonny [phone] and Reed [phone]. Patient was confused and did not participate in assessment. Per Mariea Clonts he is  patient's HPOA. Sons believe that patient would have a hard time trying to do PT and would like to continue hospice. CSW explained that Rachel Bo is willing to accept patient back at [facility]. Sons prefer for patient to return to ALF and continue hospice. CSW explained that if SNF is needed that patient can go under private pay and keep hospice however she can't afford private pay at a SNF per sons. CSW also explained that in order to go to SNF under insurance patient would have to stop hospice services and get approval from her insurance. Sons do not want to stop hospice and want patient to return to ALF. [facility] liaison is aware of above. CSW will continue to follow and assist as needed.   Employment status:  Disabled (Comment on whether or not currently receiving Disability), Retired Nurse, adult PT Recommendations:  Not assessed at this time Information / Referral to community resources:  Other (Comment Required)(ALF vs. SNF)  Patient/Family's Response to care:  Patient's sons prefer for D/C back to ALF and continue hospice.   Patient/Family's Understanding of and Emotional Response to Diagnosis, Current Treatment, and Prognosis:  Patient's sons were very pleasant and thanked CSW for assistance.   Emotional Assessment Appearance:  Appears stated age Attitude/Demeanor/Rapport:  Unable to Assess Affect (typically observed):  Unable to Assess Orientation:  Oriented to Self, Fluctuating Orientation (Suspected and/or  reported Sundowners) Alcohol / Substance use:  Not Applicable Psych involvement (Current and /or in the community):  No (Comment)  Discharge Needs  Concerns to be addressed:  Discharge Planning Concerns Readmission within the last 30 days:  No Current discharge risk:  Dependent with Mobility, Cognitively Impaired, Chronically ill, Terminally ill Barriers to Discharge:  Continued Medical Work up   [employer], Veronia Beets, LCSW 02/10/2018, 11:51  AM

## 2018-02-10 NOTE — Anesthesia Post-op Follow-up Note (Signed)
Anesthesia QCDR form completed.        

## 2018-02-10 NOTE — Progress Notes (Signed)
[facility]   PATIENT NAME: Jillian Horn    MR#:  [id number]  DATE OF BIRTH:  01-18-1930  SUBJECTIVE:   Patient came in after she sustained a fall at the [facility]. Patient is not ambulate. She is under hospice care. Currently resting. Son in the room. REVIEW OF SYSTEMS:   Review of Systems  Unable to perform ROS: Dementia   Tolerating Diet:npo Tolerating PT: patient is not walk  DRUG ALLERGIES:   Allergies  Allergen Reactions  . Codeine Sulfate Other (See Comments)    Reaction:  Unknown   . Lidocaine Other (See Comments)    Reaction:  Unknown   . Sulfa Antibiotics Other (See Comments)    Reaction:  Unknown   . Sulfasalazine Other (See Comments)    Reaction:  Unknown     VITALS:  Blood pressure 111/63, pulse 98, temperature 98.1 F (36.7 C), temperature source Axillary, resp. rate 20, height 5\' 2"  (1.575 m), weight 52 kg, SpO2 100 %.  PHYSICAL EXAMINATION:   Physical Exam limited exam secondary to dementia  GENERAL:  84 y.o.-year-old patient lying in the bed with no acute distress. Thin frail cachectic EYES: Pupils equal, round, reactive to light and accommodation. No scleral icterus. Extraocular muscles intact.  HEENT: Head atraumatic, normocephalic. Oropharynx and nasopharynx clear.  NECK:  Supple, no jugular venous distention. No thyroid enlargement, no tenderness.  LUNGS: Normal breath sounds bilaterally, no wheezing, rales, rhonchi. No use of accessory muscles of respiration.  CARDIOVASCULAR: S1, S2 normal. No murmurs, rubs, or gallops.  ABDOMEN: Soft, nontender, nondistended. Bowel sounds present. No organomegaly or mass.  EXTREMITIES: chronic arthritis changes noted. Left lower extremity externally rotated NEUROLOGIC: no focal deficit grossly intact PSYCHIATRIC:  patient is sleeping. She has advanced dementia. SKIN: No obvious rash, lesion, or ulcer.   LABORATORY PANEL:  CBC Recent Labs  Lab 02/10/18 0437   WBC 8.0  HGB 9.6*  HCT 29.8*  PLT 158    Chemistries  Recent Labs  Lab 02/09/18 1759 02/10/18 0437  NA 144 141  K 4.8 4.1  CL 111 107  CO2 31 28  GLUCOSE 108* 132*  BUN 22 22  CREATININE 0.82 0.83  CALCIUM 9.0 8.9  AST 22  --   ALT 13  --   ALKPHOS 48  --   BILITOT 0.7  --    Cardiac Enzymes No results for input(s): TROPONINI in the last 168 hours. RADIOLOGY:  Dg Chest 1 View  Result Date: 02/09/2018 CLINICAL DATA:  Initial evaluation for acute trauma, fall. EXAM: CHEST  1 VIEW COMPARISON:  Prior radiograph from 02/12/2017. FINDINGS: Cardiac and mediastinal silhouettes are stable in size and contour, and remain within normal limits. Aortic atherosclerosis. Large hiatal hernia, similar to previous. Lungs normally inflated. No focal infiltrates, edema, or effusion. No pneumothorax. No acute osseous abnormality.  Osteopenia. IMPRESSION: 1. No active cardiopulmonary disease. 2. Large hiatal hernia. 3. Aortic atherosclerosis. Electronically Signed   By: 02/14/2017 M.D.   On: 02/09/2018 18:48   Dg Hip Unilat With Pelvis 2-3 Views Left  Result Date: 02/09/2018 CLINICAL DATA:  Initial evaluation for acute trauma, fall. EXAM: DG HIP (WITH OR WITHOUT PELVIS) 2-3V LEFT COMPARISON:  Prior radiograph from 02/12/2017. FINDINGS: Acute comminuted intertrochanteric fracture of the left hip with mild superior subluxation and displacement. Bony pelvis intact. Right total arthroplasty in place without obvious abnormality. Osteopenia. IMPRESSION: Acute comminuted intertrochanteric left hip fracture. Electronically Signed   By: 02/14/2017  M.D.   On: 02/09/2018 18:47   ASSESSMENT AND PLAN:  Jillian Horn  is a 84 y.o. female with a known history of dementia.  She is followed by hospice at the [facility].  Patient does not recall what happened but apparently had a fall.  She is not that mobile to start with for the past 2-1/2-years she basically pivots to get to the commode.   In the ER she was found to have a left hip fracture  1.   acute left hip fracture status post fall  -preoperative consultation for left hip fracture closed initial encounter.  -  per Dr. Mathews Robinsons assessment no contraindications to surgery at this time if family and surgeon agreed to procedure.  The patient is followed by hospice.  -  Pain control with tramadol standing dose, Roxanol as needed, morphine IV as needed.  - -Dr. Ernest Pine from orthopedic surgery to see the patien and discussed with the family options.  2.  Dementia.  Followed by hospice.  PRN Seroquel.  3.  Hypernatremia from poor appetite.  Hydrate with D5W.  4.  History of osteoporosis  5.  History of rheumatoid arthritis  D/w sons in the room  Case discussed with Care Management/Social Worker. Management plans discussed with the patient, family and they are in agreement.  CODE STATUS: DNR  DVT Prophylaxis: ScD pending hip surgery  TOTAL TIME TAKING CARE OF THIS PATIENT: 30 minutes.  >50% time spent on counselling and coordination of care  POSSIBLE D/C IN  2-3* DAYS, DEPENDING ON CLINICAL CONDITION.  Note: This dictation was prepared with Dragon dictation along with smaller phrase technology. Any transcriptional errors that result from this process are unintentional.  Enedina Finner M.D on 02/10/2018 at 8:13 AM  Between 7am to 6pm - Pager - [phone]  After 6pm go to [email or web address] - Social research officer, government  [facility]  Office  [phone]  CC: Primary care physician; Dale Canjilon, MDPatient ID: Jillian Horn, female   DOB: 1929-03-31, 84 y.o.   MRN: [id number]

## 2018-02-10 NOTE — NC FL2 (Signed)
[place] MEDICAID FL2 LEVEL OF CARE SCREENING TOOL     IDENTIFICATION  Patient Name: SHANDIIN EISENBEIS Birthdate: Aug 24, 1929 Sex: female Admission Date (Current Location): 02/09/2018  [place] Number:  Chiropodist and Address:  [facility], [address], [place], [place] [address]      Provider Number: [id number]  Attending Physician Name and Address:  Enedina Finner, MD  Relative Name and Phone Number:       Current Level of Care: Hospital Recommended Level of Care: Assisted Living Facility Prior Approval Number:    Date Approved/Denied:   PASRR Number: ([id number])  Discharge Plan: Domiciliary (Rest home)    Current Diagnoses: Patient Active Problem List   Diagnosis Date Noted  . Closed left hip fracture (HCC) 02/09/2018  . Acute cystitis 02/12/2017  . Hip fracture (HCC) 09/15/2015  . Noncompliance with medications 11/08/2014  . Health care maintenance 05/08/2014  . Rheumatoid arthritis (HCC) 08/10/2012  . Anemia 08/10/2012  . Osteoporosis 08/10/2012  . Hypercholesterolemia 08/10/2012  . Dementia (HCC) 08/10/2012  . Hiatal hernia 08/10/2012    Orientation RESPIRATION BLADDER Height & Weight     Self  O2(2 Liters Oxygen. ) Incontinent Weight: 114 lb 10.2 oz (52 kg) Height:  5\' 2"  (157.5 cm)  BEHAVIORAL SYMPTOMS/MOOD NEUROLOGICAL BOWEL NUTRITION STATUS      Continent Diet(Regular Diet )  AMBULATORY STATUS COMMUNICATION OF NEEDS Skin   Extensive Assist Verbally Surgical wounds                       Personal Care Assistance Level of Assistance  Bathing, Feeding, Dressing Bathing Assistance: Limited assistance Feeding assistance: Limited assistance Dressing Assistance: Limited assistance     Functional Limitations Info  Sight, Hearing, Speech Sight Info: Adequate Hearing Info: Adequate Speech Info: Adequate    SPECIAL CARE FACTORS FREQUENCY  (Followed by [facility])                     Contractures      Additional Factors Info  Code Status, Allergies, Isolation Precautions Code Status Info: (DNR ) Allergies Info: (Codeine Sulfate, Lidocaine, Sulfa Antibiotics, Sulfasalazine)     Isolation Precautions Info: (History of ESBL in urine. )     Current Medications (02/10/2018):  This is the current hospital active medication list Current Facility-Administered Medications  Medication Dose Route Frequency Provider Last Rate Last Dose  . acetaminophen (TYLENOL) tablet 650 mg  650 mg Oral Q6H PRN 02/12/2018, MD       Or  . acetaminophen (TYLENOL) suppository 650 mg  650 mg Rectal Q6H PRN Alford Highland, MD      . dextrose 5 % solution   Intravenous Continuous Alford Highland, MD 50 mL/hr at 02/10/18 0531    . LORazepam (ATIVAN) tablet 0.5 mg  0.5 mg Oral Q8H PRN 02/12/18, MD   0.5 mg at 02/10/18 0830  . morphine 2 MG/ML injection 1 mg  1 mg Intravenous Q3H PRN 02/12/18, MD   1 mg at 02/10/18 0537  . morphine CONCENTRATE 10 MG/0.5ML oral solution 5 mg  5 mg Oral Q2H PRN 02/12/18, MD   5 mg at 02/10/18 0940  . ondansetron (ZOFRAN) tablet 4 mg  4 mg Oral Q6H PRN Wieting, Richard, MD       Or  . ondansetron (ZOFRAN) injection 4 mg  4 mg Intravenous Q6H PRN 02/12/18, MD      . QUEtiapine (SEROQUEL) tablet  25 mg  25 mg Oral Daily PRN Alford Highland, MD      . traMADol Janean Sark) tablet 50 mg  50 mg Oral TID Alford Highland, MD         Discharge Medications: Please see discharge summary for a list of discharge medications.  Relevant Imaging Results:  Relevant Lab Results:   Additional Information (SSN: [id number])  Dayle Mcnerney, Darleen Crocker, LCSW

## 2018-02-10 NOTE — Op Note (Signed)
OPERATIVE NOTE  DATE OF SURGERY:  02/10/2018  PATIENT NAME:  Jillian Horn   DOB: 1929/10/02  MRN: [id number]  PRE-OPERATIVE DIAGNOSIS: Left intertrochanteric femur fracture  POST-OPERATIVE DIAGNOSIS:  Same  PROCEDURE: Open reduction and internal fixation of a left intertrochanteric femur fracture   SURGEON:  Jena Gauss. M.D.  ANESTHESIA: spinal  ESTIMATED BLOOD LOSS: 40 mL  FLUIDS REPLACED: 900 mL of crystalloid  DRAINS: None  IMPLANTS UTILIZED: Synthes 11 mm/130 trochanteric fixation nail, 85 mm helical blade, 36 mm x 5.0 mm locking screw  INDICATIONS FOR SURGERY: Jillian Horn is a 84 y.o. year old female who fell and sustained a displaced left intertrochanteric femur fracture. After discussion of the risks and benefits of surgical intervention, the patient's family expressed understanding of the risks benefits and agree with plans for open reduction and internal fixation.   The risks, benefits, and alternatives were discussed at length including but not limited to the risks of infection, bleeding, nerve injury, stiffness, blood clots, the need for revision surgery, limb length inequality, cardiopulmonary complications, among others, and they were willing to proceed.  PROCEDURE IN DETAIL: The patient was brought into the operating room and, after adequate spinal anesthesia was achieved, patient was placed on the fracture table. All bony prominences were well-padded. The left lower extremity was placed in traction and a provisional reduction was performed and verified using the C-arm. The patient's left hip and leg were cleaned and prepped with alcohol and DuraPrep and draped in the usual sterile fashion. A "timeout" was performed as per usual protocol. A lateral incision was made extended from the proximal portion of the greater trochanter proximally. The fascia was incised in line with the skin incision and the fibers of the hip abductors were split in line. The tip of  the greater trochanter was palpated and a distally threaded guide pin was inserted into the tip of the greater trochanter and advanced into the medullary canal. Position was confirmed in both AP and lateral planes using the C-arm. A pilot hole was enlarged using a step drill. A Synthes 11 mm/130 trochanteric fixation nail was advanced over the guidepin and position confirmed using the C-arm. A second stab incision was made and the tissue protector was inserted through the outrigger device and advanced to the lateral cortex of the femur. A threaded screw guide pin was inserted into the femoral neck and head and position was again confirmed in both AP and lateral planes. Vision was were obtained and it was felt that an 85 mm helical blade was appropriate. The cortex was reamed and then a cannulated reamer was advanced over the guidepin to the appropriate depth. An 85 mm helical blade was then advanced over the guidepin and impacted into place. Good position was noted in multiple planes using the C-arm. The locking sleeve was engaged. Finally, a third stab incision was made and the tissue protector was inserted through the outrigger device and advanced the lateral cortex of the femur for placement of the distal locking screw. A 36 mm x 5.0 mm locking screw was then inserted. The outrigger device was removed. The hip was visualized in all planes using the C-arm with good reduction appreciated and good position of the hardware noted.  The wound was irrigated with copious amounts of normal saline with antibiotic solution and suctioned dry. Good hemostasis was appreciated. The fascia was reapproximated using interrupted sutures of #1 Vicryl. Subcutaneous tissue was approximated layers using first #0 Vicryl followed #2-0  Vicryl. The skin was closed with skin staples. A sterile dressing was applied.  The patient tolerated the procedure well and was transported to the recovery room in stable condition.   Marciano Sequin., M.D.

## 2018-02-10 NOTE — Progress Notes (Signed)
Pt agitated earlier this AM per family. Ativan given. Pt still was agitated after Ativan given. PRN liquid morphine given for pain. Pt now resting quietly in room. Family at bedside.

## 2018-02-11 ENCOUNTER — Encounter: Payer: Self-pay | Admitting: Orthopedic Surgery

## 2018-02-11 MED ORDER — FERROUS SULFATE 325 (65 FE) MG PO TABS: 325.0000 mg | ORAL_TABLET | Freq: Two times a day (BID) | ORAL | 3 refills | Status: AC

## 2018-02-11 MED ORDER — ACETAMINOPHEN 160 MG/5ML PO SOLN: 650.0000 mg | Freq: Four times a day (QID) | ORAL | 0 refills | Status: AC | PRN

## 2018-02-11 MED ORDER — ACETAMINOPHEN 325 MG PO TABS: 325.0000 mg | ORAL_TABLET | Freq: Four times a day (QID) | ORAL | 0 refills | Status: DC | PRN

## 2018-02-11 MED ORDER — OXYCODONE HCL 5 MG PO TABS: 5.0000 mg | ORAL_TABLET | ORAL | Status: DC | PRN

## 2018-02-11 MED ORDER — ENOXAPARIN SODIUM 40 MG/0.4ML ~~LOC~~ SOLN: 40.0000 mg | SUBCUTANEOUS | 0 refills | Status: DC

## 2018-02-11 MED ORDER — ASCRIPTIN 325 MG PO TABS: 1.0000 | ORAL_TABLET | Freq: Every day | ORAL | 0 refills | Status: AC

## 2018-02-11 MED ORDER — ACETAMINOPHEN 160 MG/5ML PO SOLN
650.0000 mg | Freq: Four times a day (QID) | ORAL | Status: DC | PRN
  Filled 2018-02-11: qty 20.3

## 2018-02-11 NOTE — Progress Notes (Signed)
Report called to [facility] at [facility]. EMS called for transport. IV removed. Pt dressed and awaiting transfer.

## 2018-02-11 NOTE — NC FL2 (Signed)
[place] MEDICAID FL2 LEVEL OF CARE SCREENING TOOL     IDENTIFICATION  Patient Name: Jillian Horn Birthdate: Dec 07, 1929 Sex: female Admission Date (Current Location): 02/09/2018  [place] and [place] Number:  Chiropodist and Address:  [facility], [address] [place], [place] [address]      Provider Number: [id number]  Attending Physician Name and Address:  Enedina Finner, MD  Relative Name and Phone Number:       Current Level of Care: Hospital Recommended Level of Care: Assisted Living Facility Prior Approval Number:    Date Approved/Denied:   PASRR Number: ([id number])  Discharge Plan: Domiciliary (Rest home)    Current Diagnoses: Patient Active Problem List   Diagnosis Date Noted  . Closed left hip fracture (HCC) 02/09/2018  . Acute cystitis 02/12/2017  . Hip fracture (HCC) 09/15/2015  . Noncompliance with medications 11/08/2014  . Health care maintenance 05/08/2014  . Rheumatoid arthritis (HCC) 08/10/2012  . Anemia 08/10/2012  . Osteoporosis 08/10/2012  . Hypercholesterolemia 08/10/2012  . Dementia (HCC) 08/10/2012  . Hiatal hernia 08/10/2012    Orientation RESPIRATION BLADDER Height & Weight     Self  Room Air  Incontinent Weight: 114 lb 10.2 oz (52 kg) Height:  5\' 2"  (157.5 cm)  BEHAVIORAL SYMPTOMS/MOOD NEUROLOGICAL BOWEL NUTRITION STATUS      Continent Diet: Low Sodium/ Heart Healthy   AMBULATORY STATUS COMMUNICATION OF NEEDS Skin   Extensive Assist Verbally Surgical wounds  Incision: Left Hip                       Personal Care Assistance Level of Assistance  Bathing, Feeding, Dressing Bathing Assistance: Limited assistance Feeding assistance: Limited assistance Dressing Assistance: Limited assistance     Functional Limitations Info  Sight, Hearing, Speech Sight Info: Adequate Hearing Info: Adequate Speech Info: Adequate    SPECIAL CARE FACTORS FREQUENCY  (Followed by [facility])                     Contractures      Additional Factors Info  Code Status, Allergies, Isolation Precautions Code Status Info: (DNR ) Allergies Info: (Codeine Sulfate, Lidocaine, Sulfa Antibiotics, Sulfasalazine)     Isolation Precautions Info: (History of ESBL in urine. )    Discharge Medications: Please see discharge summary for a list of discharge medications. Medication List    TAKE these medications   acetaminophen 160 MG/5ML solution Commonly known as:  TYLENOL Take 20.3 mLs (650 mg total) by mouth every 6 (six) hours as needed for mild pain or moderate pain.   ASCRIPTIN 325 MG Tabs Take 1 tablet by mouth daily.   barrier cream Crea Commonly known as:  non-specified Apply 1 application topically as needed (skin protection after tolieting).   ferrous sulfate 325 (65 FE) MG tablet Take 1 tablet (325 mg total) by mouth 2 (two) times daily with a meal.   LORazepam 0.5 MG tablet Commonly known as:  ATIVAN Take 0.5 mg by mouth every 8 (eight) hours as needed for anxiety or sleep.   menthol-zinc oxide powder Apply 1 application topically daily as needed (skin irritation).   morphine CONCENTRATE 10 mg / 0.5 ml concentrated solution Take 5 mg by mouth every hour as needed for severe pain.   NON FORMULARY Apply 0.5-1 mg topically daily as needed (agitation). Lorazepam topical gel (0.5MG /ML)   pyrithione zinc 1 % shampoo Commonly known as:  HEAD AND SHOULDERS Apply 1 application topically  daily as needed for itching.   QUEtiapine 25 MG tablet Commonly known as:  SEROQUEL Take 25 mg by mouth daily as needed (agitation).   traMADol 50 MG tablet Commonly known as:  ULTRAM Take 50 mg by mouth 3 (three) times daily.   Relevant Imaging Results: Relevant Lab Results: Additional Information (SSN: [id number])  Porscha Axley, Darleen Crocker, LCSW

## 2018-02-11 NOTE — Progress Notes (Signed)
Pt up to [facility] with 2 assist. Stand and pivot. Tolerated well. Pt had BM, trying to void now.

## 2018-02-11 NOTE — Progress Notes (Signed)
OT Cancellation Note  Patient Details Name: Jillian Horn MRN: [id number] DOB: 1930/03/03   Cancelled Treatment:    Reason Eval/Treat Not Completed: OT screened, no needs identified, will sign off. Order received, chart reviewed. Pt requires total assist for bathing/dressing/toileting from staff at baseline, primarily w/c level at ALF, and requires occasional assist for self feeding from staff. Plan to return to ALF where she is currently followed by hospice. Spoke with hospice RN and son in room. No skilled OT needs at this time. Will sign off.   Richrd Prime, MPH, MS, OTR/L ascom [phone] 02/11/18, 9:54 AM

## 2018-02-11 NOTE — Plan of Care (Signed)
  Problem: Education: Goal: Knowledge of General Education information will improve Description Including pain rating scale, medication(s)/side effects and non-pharmacologic comfort measures Outcome: Progressing   

## 2018-02-11 NOTE — Evaluation (Signed)
Physical Therapy Evaluation Patient Details Name: Jillian Horn MRN: [id number] DOB: [birth year] Today's Date: 02/11/2018   History of Present Illness  presented to ER secondary to mechanical fall with L hip pain; admitted with L comminuted intertrochanteric hip fracture, s/p ORIF (11/26), WBAT.  Clinical Impression  Patient resting calmly in bed upon arrival to session.  Opens eyes to voice, but does not verbally communicate or respond to/interact with therapist.  Unable to follow commands, requiring hand-over-hand/active assist from therapist for movement of all extremities; significant facial grimacing noted with movement of L hip.  Currently requiring total assist +2 for bed mobility and supine/sit, but able to maintain unsupported sitting edge of bed with close sup once accommodated to upright position.  Significant thoracic kyphosis noted.  Slightly restless/fidgety with movement transition (likely related to pain?); transfers and OOB attempts deferred as result. Returned to supine for comfort. Would benefit from skilled PT to address above deficits and promote optimal return to PLOF; recommend transition to STR upon discharge from acute hospitalization pending goals of care and ability to actively participate/progress     Follow Up Recommendations SNF(pending goals of care and ability to actively participate/progress)    Equipment Recommendations       Recommendations for Other Services       Precautions / Restrictions Precautions Precautions: Fall Precaution Comments: Contact iso Restrictions Weight Bearing Restrictions: Yes LLE Weight Bearing: Weight bearing as tolerated      Mobility  Bed Mobility Overal bed mobility: Needs Assistance Bed Mobility: Supine to Sit     Supine to sit: Total assist;+2 for physical assistance Sit to supine: Total assist;+2 for physical assistance   General bed mobility comments: significant grimacing with movement transition, but does  not resist; very fearful  Transfers                 General transfer comment: unsafe/unable  Ambulation/Gait             General Gait Details: unsafe/unable; non-ambulatory at baseline  Stairs            Wheelchair Mobility    Modified Rankin (Stroke Patients Only)       Balance Overall balance assessment: Needs assistance Sitting-balance support: No upper extremity supported;Feet supported Sitting balance-Leahy Scale: Fair Sitting balance - Comments: close sup once accommodated to position                                     Pertinent Vitals/Pain Pain Assessment: Faces Faces Pain Scale: Hurts whole lot Pain Location: L hip Pain Descriptors / Indicators: Aching;Grimacing;Guarding Pain Intervention(s): Limited activity within patient's tolerance;Monitored during session;Repositioned    Home Living Family/patient expects to be discharged to:: Assisted living                 Additional Comments: Resident of the [facility]F x2 years per chart    Prior Function           Comments: Per chart (patient unable to provide info), patient WC level as primary mobility, pivots with assist between seating surfaces     Hand Dominance        Extremity/Trunk Assessment   Upper Extremity Assessment Upper Extremity Assessment: Generalized weakness(grossly 3-/5 throughout)    Lower Extremity Assessment Lower Extremity Assessment: Generalized weakness(grossly at least 3-/5 throughout; limited ability to participate with ROM/therex.  Prefers L LE resting in position of IR)  Communication      Cognition Arousal/Alertness: Lethargic Behavior During Therapy: WFL for tasks assessed/performed Overall Cognitive Status: No family/caregiver present to determine baseline cognitive functioning                                 General Comments: opens eyes to voice, but does not conversate or respond verbally.  Unable to follow  commands; poor comprehension/awareness of situation.      General Comments      Exercises Other Exercises Other Exercises: Supine L LE therex, act assist/passive: ankle pumps, SAQs, heel slides, hip abduct/adduct.  Limited ability to actively assist/participate with supine therex.  L hip ROM limited by pain.   Assessment/Plan    PT Assessment Patient needs continued PT services  PT Problem List Decreased strength;Decreased range of motion;Decreased activity tolerance;Decreased balance;Decreased mobility;Decreased coordination;Decreased cognition;Decreased knowledge of use of DME;Decreased safety awareness;Decreased knowledge of precautions;Decreased skin integrity;Pain;Cardiopulmonary status limiting activity       PT Treatment Interventions DME instruction;Functional mobility training;Therapeutic activities;Therapeutic exercise;Balance training;Patient/family education    PT Goals (Current goals can be found in the Care Plan section)  Acute Rehab PT Goals PT Goal Formulation: Patient unable to participate in goal setting Time For Goal Achievement: 02/25/18 Potential to Achieve Goals: Fair    Frequency 7X/week   Barriers to discharge        Co-evaluation               AM-PAC PT "6 Clicks" Mobility  Outcome Measure Help needed turning from your back to your side while in a flat bed without using bedrails?: Total Help needed moving from lying on your back to sitting on the side of a flat bed without using bedrails?: Total Help needed moving to and from a bed to a chair (including a wheelchair)?: Total Help needed standing up from a chair using your arms (e.g., wheelchair or bedside chair)?: Total Help needed to walk in hospital room?: Total Help needed climbing 3-5 steps with a railing? : Total 6 Click Score: 6    End of Session Equipment Utilized During Treatment: Gait belt Activity Tolerance: Patient limited by pain Patient left: in bed;with call bell/phone within  reach;with bed alarm set Nurse Communication: Mobility status      Time: 1638-4665 PT Time Calculation (min) (ACUTE ONLY): 20 min   Charges:   PT Evaluation $PT Eval Moderate Complexity: 1 Mod PT Treatments $Therapeutic Exercise: 8-22 mins        Cline Draheim H. Manson Passey, PT, DPT, NCS 02/11/18, 9:32 AM [phone]

## 2018-02-11 NOTE — Progress Notes (Signed)
Patient is medically stable for D/C back to [facility] today and continue [facility]. Per Conservator, museum/gallery at [employer] patient can return today. RN will call report and arrange EMS for transport. Per [facility] patient can return with liquid tylenol. Clinical Child psychotherapist (CSW) sent D/C orders to [employer]. Patient's son [name] and Loleta Books are aware of above. [facility] liaison is aware of above. Please reconsult if future social work needs arise. CSW signing off.   [employer], LCSW [phone]

## 2018-02-11 NOTE — Progress Notes (Signed)
[facility] [place] at [facility]   PATIENT NAME: Jillian Horn    MR#:  [id number]  DATE OF BIRTH:  [birth year]  SUBJECTIVE:   Patient came in after she sustained a fall at the [facility]. Patient does not ambulate at baseline She is under hospice care. Confusion due to dementia REVIEW OF SYSTEMS:   Review of Systems  Unable to perform ROS: Dementia   Tolerating Diet:reg diet Tolerating PT: patient is not walk at baseline  DRUG ALLERGIES:   Allergies  Allergen Reactions  . Codeine Sulfate Other (See Comments)    Reaction:  Unknown   . Lidocaine Other (See Comments)    Reaction:  Unknown   . Sulfa Antibiotics Other (See Comments)    Reaction:  Unknown   . Sulfasalazine Other (See Comments)    Reaction:  Unknown     VITALS:  Blood pressure (!) 89/58, pulse 80, temperature (!) 97.5 F (36.4 C), temperature source Axillary, resp. rate 19, height 5\' 2"  (1.575 m), weight 52 kg, SpO2 99 %.  PHYSICAL EXAMINATION:   Physical Exam limited exam secondary to dementia  GENERAL:  84 y.o.-year-old patient lying in the bed with no acute distress. Thin frail cachectic EYES: Pupils equal, round, reactive to light and accommodation. No scleral icterus.   HEENT: Head atraumatic, normocephalic. Oropharynx and nasopharynx clear.  NECK:  Supple, no jugular venous distention. No thyroid enlargement, no tenderness.  LUNGS: Normal breath sounds bilaterally, no wheezing, rales, rhonchi. No use of accessory muscles of respiration.  CARDIOVASCULAR: S1, S2 normal. No murmurs, rubs, or gallops.  ABDOMEN: Soft, nontender, nondistended. Bowel sounds present. No organomegaly or mass.  EXTREMITIES: chronic arthritis changes noted. Left lower extremity externally rotated NEUROLOGIC: non focal, no deficit, grossly intact PSYCHIATRIC:  patient is sleeping. She has advanced dementia. SKIN: No obvious rash, lesion, or ulcer.   LABORATORY PANEL:  CBC Recent Labs  Lab  02/10/18 0437  WBC 8.0  HGB 9.6*  HCT 29.8*  PLT 158    Chemistries  Recent Labs  Lab 02/09/18 1759 02/10/18 0437  NA 144 141  K 4.8 4.1  CL 111 107  CO2 31 28  GLUCOSE 108* 132*  BUN 22 22  CREATININE 0.82 0.83  CALCIUM 9.0 8.9  AST 22  --   ALT 13  --   ALKPHOS 48  --   BILITOT 0.7  --    Cardiac Enzymes No results for input(s): TROPONINI in the last 168 hours. RADIOLOGY:  Dg Chest 1 View  Result Date: 02/09/2018 CLINICAL DATA:  Initial evaluation for acute trauma, fall. EXAM: CHEST  1 VIEW COMPARISON:  Prior radiograph from 02/12/2017. FINDINGS: Cardiac and mediastinal silhouettes are stable in size and contour, and remain within normal limits. Aortic atherosclerosis. Large hiatal hernia, similar to previous. Lungs normally inflated. No focal infiltrates, edema, or effusion. No pneumothorax. No acute osseous abnormality.  Osteopenia. IMPRESSION: 1. No active cardiopulmonary disease. 2. Large hiatal hernia. 3. Aortic atherosclerosis. Electronically Signed   By: 02/14/2017 M.D.   On: 02/09/2018 18:48   Dg C-arm 1-60 Min-no Report  Result Date: 02/10/2018 Fluoroscopy was utilized by the requesting physician.  No radiographic interpretation.   Dg Hip Operative Unilat W Or W/o Pelvis Left  Result Date: 02/10/2018 CLINICAL DATA:  Hip fracture EXAM: OPERATIVE left HIP (WITH PELVIS IF PERFORMED) 4 VIEWS TECHNIQUE: Fluoroscopic spot image(s) were submitted for interpretation post-operatively. COMPARISON:  02/09/2018 FINDINGS: Four low resolution intraoperative spot views of the left hip. The  images demonstrate intramedullary rod and screw fixation of left trochanteric fracture. IMPRESSION: Intraoperative fluoroscopic assistance provided during surgical fixation of left intertrochanteric fracture Electronically Signed   By: Jasmine Pang M.D.   On: 02/10/2018 21:21   Dg Hip Unilat With Pelvis 2-3 Views Left  Result Date: 02/09/2018 CLINICAL DATA:  Initial evaluation  for acute trauma, fall. EXAM: DG HIP (WITH OR WITHOUT PELVIS) 2-3V LEFT COMPARISON:  Prior radiograph from 02/12/2017. FINDINGS: Acute comminuted intertrochanteric fracture of the left hip with mild superior subluxation and displacement. Bony pelvis intact. Right total arthroplasty in place without obvious abnormality. Osteopenia. IMPRESSION: Acute comminuted intertrochanteric left hip fracture. Electronically Signed   By: Rise Mu M.D.   On: 02/09/2018 18:47   ASSESSMENT AND PLAN:  Jillian Horn  is a 84 y.o. female with a known history of dementia.  She is followed by hospice at the [facility].  Patient does not recall what happened but apparently had a fall.  She is not that mobile to start with for the past 2-1/2-years she basically pivots to get to the commode.  In the ER she was found to have a left hip fracture  1.   acute left hip fracture status post fall   -  POD #1Open reduction and internal fixation of a left intertrochanteric femur fracture--per Dr Ernest Pine The patient is followed by hospice.  - Pain control with prn pain meds -relative low bp  2. Dementia.  Followed by hospice.  PRN Seroquel.  3. Hypernatremia from poor appetite.  Hydrate with D5W. -sodium 141  4.  History of osteoporosis  5.  History of rheumatoid arthritis  CSW for d/c planning  Case discussed with Care Management/Social Worker. Management plans discussed with the patient, family and they are in agreement.  CODE STATUS: DNR  DVT Prophylaxis: ScD pending hip surgery  TOTAL TIME TAKING CARE OF THIS PATIENT: 30 minutes.  >50% time spent on counselling and coordination of care  POSSIBLE D/C IN  1-2 DAYS, DEPENDING ON CLINICAL CONDITION.  Note: This dictation was prepared with Dragon dictation along with smaller phrase technology. Any transcriptional errors that result from this process are unintentional.  Enedina Finner M.D on 02/11/2018 at 7:46 AM  Between 7am to 6pm - Pager -  [phone]  After 6pm go to [email or web address] - Social research officer, government  [facility]  Office  [phone]  CC: Primary care physician; Dale McKenna, MDPatient ID: Jillian Horn, female   DOB: 15-Oct-1929, 84 y.o.   MRN: [id number]

## 2018-02-11 NOTE — Discharge Summary (Addendum)
[facility] - [place] at [facility]   PATIENT NAME: Jillian Horn    MR#:  [id number]  DATE OF BIRTH:  09/24/1929  DATE OF ADMISSION:  02/09/2018 ADMITTING PHYSICIAN: Alford Highland, MD  DATE OF DISCHARGE: 02/11/2018  PRIMARY CARE PHYSICIAN: Dale [place], MD    ADMISSION DIAGNOSIS:  Hip fracture (HCC) [S72.009A] Closed fracture of left hip, initial encounter (HCC) [S72.002A]  DISCHARGE DIAGNOSIS:  Acute left hip intertrochanteric fracture s/p ORIF  Advance Dementia SECONDARY DIAGNOSIS:   Past Medical History:  Diagnosis Date  . Anemia    erosion on EGD  . Dementia (HCC)   . Diverticulosis   . Hiatal hernia   . Hypercholesterolemia   . Osteoporosis    s/p fosamax, s/p forteo, reclast  . Rheumatoid arthritis(714.0)    seropositive, oligo-articular.  MTX, s/p plaquenil, cytopenia on sulfasalazine, s/p remicade    HOSPITAL COURSE:  MaryChandleris a84 y.o.femalewith a known history of dementia. She is followed by hospice at the [facility]. Patient does not recall what happened but apparently had a fall. She is not that mobile to start with for the past 2-1/2-yearsshe basically pivots to get to the commode. In the ER she was found to have a left hip fracture  1. acute left hip fracture status post fall   - POD # 1 Open reduction and internal fixation of aleftintertrochanteric femur fracture--per Dr Ernest Pine The patient is followed by hospice. - Pain control with prn pain meds roxanol, ultram and tylenol -relative low bp  2. Dementia. Followed by hospice.PRN Seroquel  3. Hypernatremia from poor appetite. Hydrate with D5W. -sodium 141  4. History of osteoporosis  5. History of rheumatoid arthritis  6. DVT prophylaxis AL cannot do Lovenox --will give ASA for 6 weeks  CSW for d/c planning--pt will return back to the [facility] today with hospice to follow Pt is DNR Dr Ernest Pine aware of pt being  discharged  CONSULTS OBTAINED:  Treatment Team:  Alford Highland, MD Donato Heinz, MD  DRUG ALLERGIES:   Allergies  Allergen Reactions  . Codeine Sulfate Other (See Comments)    Reaction:  Unknown   . Lidocaine Other (See Comments)    Reaction:  Unknown   . Sulfa Antibiotics Other (See Comments)    Reaction:  Unknown   . Sulfasalazine Other (See Comments)    Reaction:  Unknown     DISCHARGE MEDICATIONS:   Allergies as of 02/11/2018      Reactions   Codeine Sulfate Other (See Comments)   Reaction:  Unknown    Lidocaine Other (See Comments)   Reaction:  Unknown    Sulfa Antibiotics Other (See Comments)   Reaction:  Unknown    Sulfasalazine Other (See Comments)   Reaction:  Unknown       Medication List    TAKE these medications   acetaminophen 160 MG/5ML solution Commonly known as:  TYLENOL Take 20.3 mLs (650 mg total) by mouth every 6 (six) hours as needed for mild pain or moderate pain.   ASCRIPTIN 325 MG Tabs Take 1 tablet by mouth daily.   barrier cream Crea Commonly known as:  non-specified Apply 1 application topically as needed (skin protection after tolieting).   ferrous sulfate 325 (65 FE) MG tablet Take 1 tablet (325 mg total) by mouth 2 (two) times daily with a meal.   LORazepam 0.5 MG tablet Commonly known as:  ATIVAN Take 0.5 mg by mouth every 8 (eight) hours as needed for anxiety or  sleep.   menthol-zinc oxide powder Apply 1 application topically daily as needed (skin irritation).   morphine CONCENTRATE 10 mg / 0.5 ml concentrated solution Take 5 mg by mouth every hour as needed for severe pain.   NON FORMULARY Apply 0.5-1 mg topically daily as needed (agitation). Lorazepam topical gel (0.5MG /ML)   pyrithione zinc 1 % shampoo Commonly known as:  HEAD AND SHOULDERS Apply 1 application topically daily as needed for itching.   QUEtiapine 25 MG tablet Commonly known as:  SEROQUEL Take 25 mg by mouth daily as needed (agitation).    traMADol 50 MG tablet Commonly known as:  ULTRAM Take 50 mg by mouth 3 (three) times daily.       If you experience worsening of your admission symptoms, develop shortness of breath, life threatening emergency, suicidal or homicidal thoughts you must seek medical attention immediately by calling 911 or calling your MD immediately  if symptoms less severe.  You Must read complete instructions/literature along with all the possible adverse reactions/side effects for all the Medicines you take and that have been prescribed to you. Take any new Medicines after you have completely understood and accept all the possible adverse reactions/side effects.   Please note  You were cared for by a hospitalist during your hospital stay. If you have any questions about your discharge medications or the care you received while you were in the hospital after you are discharged, you can call the unit and asked to speak with the hospitalist on call if the hospitalist that took care of you is not available. Once you are discharged, your primary care physician will handle any further medical issues. Please note that NO REFILLS for any discharge medications will be authorized once you are discharged, as it is imperative that you return to your primary care physician (or establish a relationship with a primary care physician if you do not have one) for your aftercare needs so that they can reassess your need for medications and monitor your lab values.  DATA REVIEW:   CBC  Recent Labs  Lab 02/10/18 0437  WBC 8.0  HGB 9.6*  HCT 29.8*  PLT 158    Chemistries  Recent Labs  Lab 02/09/18 1759 02/10/18 0437  NA 144 141  K 4.8 4.1  CL 111 107  CO2 31 28  GLUCOSE 108* 132*  BUN 22 22  CREATININE 0.82 0.83  CALCIUM 9.0 8.9  AST 22  --   ALT 13  --   ALKPHOS 48  --   BILITOT 0.7  --     Microbiology Results   Recent Results (from the past 240 hour(s))  Surgical pcr screen     Status: None    Collection Time: 02/09/18  8:57 PM  Result Value Ref Range Status   MRSA, PCR NEGATIVE NEGATIVE Final   Staphylococcus aureus NEGATIVE NEGATIVE Final    Comment: (NOTE) The Xpert SA Assay (FDA approved for NASAL specimens in patients 28 years of age and older), is one component of a comprehensive surveillance program. It is not intended to diagnose infection nor to guide or monitor treatment. Performed at [facility], [address] [place], [place] [address]     RADIOLOGY:  Dg Chest 1 View  Result Date: 02/09/2018 CLINICAL DATA:  Initial evaluation for acute trauma, fall. EXAM: CHEST  1 VIEW COMPARISON:  Prior radiograph from 02/12/2017. FINDINGS: Cardiac and mediastinal silhouettes are stable in size and contour, and remain within normal limits. Aortic atherosclerosis.  Large hiatal hernia, similar to previous. Lungs normally inflated. No focal infiltrates, edema, or effusion. No pneumothorax. No acute osseous abnormality.  Osteopenia. IMPRESSION: 1. No active cardiopulmonary disease. 2. Large hiatal hernia. 3. Aortic atherosclerosis. Electronically Signed   By: Rise Mu M.D.   On: 02/09/2018 18:48   Dg C-arm 1-60 Min-no Report  Result Date: 02/10/2018 Fluoroscopy was utilized by the requesting physician.  No radiographic interpretation.   Dg Hip Operative Unilat W Or W/o Pelvis Left  Result Date: 02/10/2018 CLINICAL DATA:  Hip fracture EXAM: OPERATIVE left HIP (WITH PELVIS IF PERFORMED) 4 VIEWS TECHNIQUE: Fluoroscopic spot image(s) were submitted for interpretation post-operatively. COMPARISON:  02/09/2018 FINDINGS: Four low resolution intraoperative spot views of the left hip. The images demonstrate intramedullary rod and screw fixation of left trochanteric fracture. IMPRESSION: Intraoperative fluoroscopic assistance provided during surgical fixation of left intertrochanteric fracture Electronically Signed   By: Jasmine Pang M.D.   On: 02/10/2018 21:21   Dg  Hip Unilat With Pelvis 2-3 Views Left  Result Date: 02/09/2018 CLINICAL DATA:  Initial evaluation for acute trauma, fall. EXAM: DG HIP (WITH OR WITHOUT PELVIS) 2-3V LEFT COMPARISON:  Prior radiograph from 02/12/2017. FINDINGS: Acute comminuted intertrochanteric fracture of the left hip with mild superior subluxation and displacement. Bony pelvis intact. Right total arthroplasty in place without obvious abnormality. Osteopenia. IMPRESSION: Acute comminuted intertrochanteric left hip fracture. Electronically Signed   By: Rise Mu M.D.   On: 02/09/2018 18:47     Management plans discussed with the patient, family and they are in agreement.  CODE STATUS:     Code Status Orders  (From admission, onward)         Start     Ordered   02/09/18 1919  Do not attempt resuscitation (DNR)  Continuous    Question Answer Comment  In the event of cardiac or respiratory ARREST Do not call a "code blue"   In the event of cardiac or respiratory ARREST Do not perform Intubation, CPR, defibrillation or ACLS   In the event of cardiac or respiratory ARREST Use medication by any route, position, wound care, and other measures to relive pain and suffering. May use oxygen, suction and manual treatment of airway obstruction as needed for comfort.   Comments rn may pronounce      02/09/18 1919        Code Status History    Date Active Date Inactive Code Status Order ID Comments User Context   02/12/2017 1614 02/14/2017 1727 DNR [id number]  Ramonita Lab, MD Inpatient   09/15/2015 1702 09/18/2015 1934 Full Code [id number]  Shaune Pollack, MD Inpatient    Advance Directive Documentation     Most Recent Value  Type of Advance Directive  Healthcare Power of Attorney [[facility] Reed [place]]  Pre-existing out of facility DNR order (yellow form or pink MOST form)  -  "MOST" Form in Place?  -      TOTAL TIME TAKING CARE OF THIS PATIENT: *40* minutes.    Enedina Finner M.D on 02/11/2018 at 12:40 PM  Between  7am to 6pm - Pager - [phone] After 6pm go to [email or web address] - Social research officer, government  [facility]  Office  [phone]  CC: Primary care physician; Dale [place], MD

## 2018-02-11 NOTE — Progress Notes (Signed)
Visit made. Patient seen sitting up in bed, eyes closed, easily awakened to voice. Patient able to take a few bites of jello, she denied pain, last pain medication given at 2:38 am, PRN tramadol. She is getting scheduled IV Tylenol. No family at bedside. Plan continues for patient to return to [facility] with continued hospice services. Will continue to follow and update Hospice team.  Dayna Barker RN, BSN, [facility] [place], hospital Liaison [phone]

## 2018-02-11 NOTE — Progress Notes (Signed)
   Subjective: 1 Day Post-Op Procedure(s) (LRB): INTRAMEDULLARY (IM) NAIL INTERTROCHANTRIC (Left) Patient not able to communicate.  Sitter in the room with patient.  States that she has been very active all night. Patient is well, and has had no acute complaints or problems We will start therapy today.  Plan is to go Rehab after hospital stay. no nausea and no vomiting Patient denies any chest pains or shortness of breath. Objective: Vital signs in last 24 hours: Temp:  [96.3 F (35.7 C)-98.2 F (36.8 C)] 97.5 F (36.4 C) (11/27 0345) Pulse Rate:  [70-122] 80 (11/27 0345) Resp:  [11-21] 19 (11/27 0345) BP: (85-168)/(51-121) 89/58 (11/27 0345) SpO2:  [96 %-100 %] 99 % (11/27 0345) well approximated incision Heels are non tender and elevated off the bed using rolled towels Intake/Output from previous day: 11/26 0701 - 11/27 0700 In: 2006.1 [I.V.:1706.1; IV Piggyback:300] Out: 580 [Urine:540; Blood:40] Intake/Output this shift: No intake/output data recorded.  Recent Labs    02/09/18 1759 02/10/18 0437  HGB 13.0 9.6*   Recent Labs    02/09/18 1759 02/10/18 0437  WBC 5.5 8.0  RBC 4.00 2.87*  HCT 40.8 29.8*  PLT 218 158   Recent Labs    02/09/18 1759 02/10/18 0437  NA 144 141  K 4.8 4.1  CL 111 107  CO2 31 28  BUN 22 22  CREATININE 0.82 0.83  GLUCOSE 108* 132*  CALCIUM 9.0 8.9   No results for input(s): LABPT, INR in the last 72 hours.  EXAM General - Patient is Disorganized and Confused Extremity - Neurologically intact Neurovascular intact Intact pulses distally No cellulitis present Compartment soft Dressing - dressing C/D/I Motor Function - intact, moving foot and toes well on exam.    Past Medical History:  Diagnosis Date  . Anemia    erosion on EGD  . Dementia (HCC)   . Diverticulosis   . Hiatal hernia   . Hypercholesterolemia   . Osteoporosis    s/p fosamax, s/p forteo, reclast  . Rheumatoid arthritis(714.0)    seropositive,  oligo-articular.  MTX, s/p plaquenil, cytopenia on sulfasalazine, s/p remicade    Assessment/Plan: 1 Day Post-Op Procedure(s) (LRB): INTRAMEDULLARY (IM) NAIL INTERTROCHANTRIC (Left) Active Problems:   Closed left hip fracture (HCC)  Estimated body mass index is 20.97 kg/m as calculated from the following:   Height as of this encounter: 5\' 2"  (1.575 m).   Weight as of this encounter: 52 kg. Advance diet Up with therapy Discharge to SNF when medically stable  Labs: Pending DVT Prophylaxis - Lovenox, Foot Pumps and TED hose Weight-Bearing as tolerated to left leg D/C O2 and Pulse OX and try on Room Air Again working on bowel movement Patient will need to follow-up in [facility] in 2 weeks after discharge Continue Lovenox 40 mg daily for 2 weeks at discharge  Jillian Len [facility]. [facility] 02/11/2018, 7:09 AM

## 2018-02-11 NOTE — Anesthesia Postprocedure Evaluation (Signed)
Anesthesia Post Note  Patient: Jillian Horn  Procedure(s) Performed: INTRAMEDULLARY (IM) NAIL INTERTROCHANTRIC (Left Hip)  Patient location during evaluation: Nursing Unit Anesthesia Type: Spinal Level of consciousness: oriented and awake and alert Pain management: pain level controlled Vital Signs Assessment: post-procedure vital signs reviewed and stable Respiratory status: spontaneous breathing and respiratory function stable Cardiovascular status: blood pressure returned to baseline and stable Postop Assessment: no headache, no backache, no apparent nausea or vomiting, patient able to bend at knees and able to ambulate Anesthetic complications: no     Last Vitals:  Vitals:   02/11/18 0815 02/11/18 0922  BP: (!) 106/58   Pulse: 79   Resp: 16   Temp: (!) 36.2 C   SpO2: 99% 95%    Last Pain:  Vitals:   02/11/18 0815  TempSrc: Oral  PainSc:                  Starling Manns

## 2018-03-18 DEATH — deceased

## 2019-02-18 IMAGING — CT CT ABD-PELV W/ CM
2 of 5 series · 16 of 46 positions shown, 18 images · IV contrast (APPLIED)
Comparison: None.

CLINICAL DATA: Abdominal pain

EXAM:
CT ABDOMEN AND PELVIS WITH CONTRAST
TECHNIQUE: Multidetector CT imaging of the abdomen and pelvis was performed
using the standard protocol following bolus administration of
intravenous contrast.
CONTRAST:  75mL [id number] IOPAMIDOL ([id number]) INJECTION 61%

[Series 4: routine abd/pel with (person_name) · axial · 0.87mm/px · z∈[-403,-18]mm · 13 of 87 slices shown, 15 images]
[im 5/87  soft-tissue]
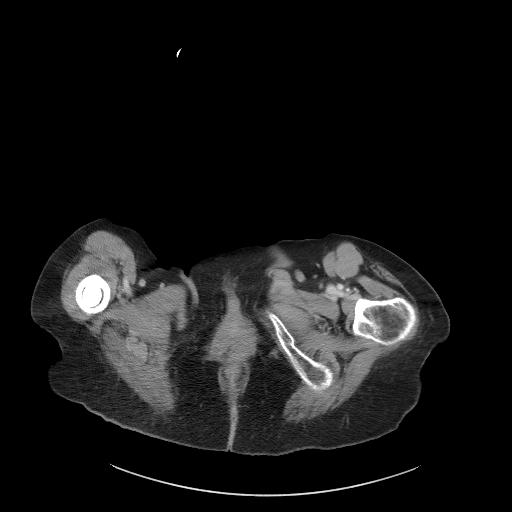
[im 5/87  bone]
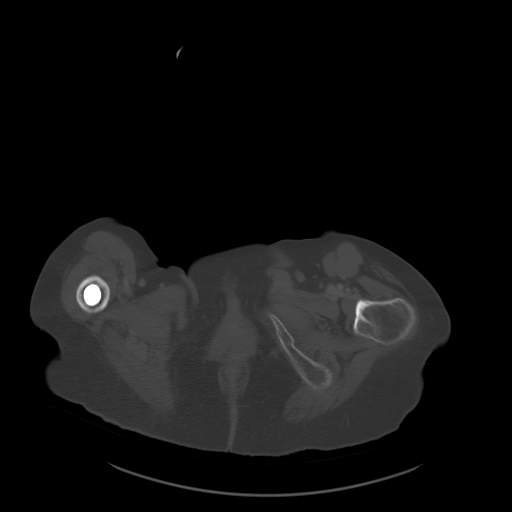
[im 10/87  soft-tissue]
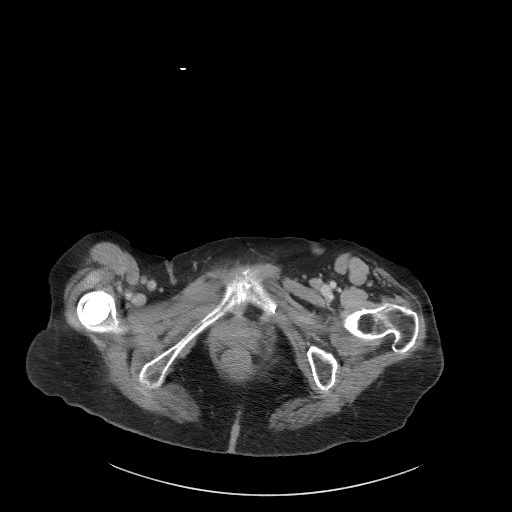
[im 20/87  soft-tissue]
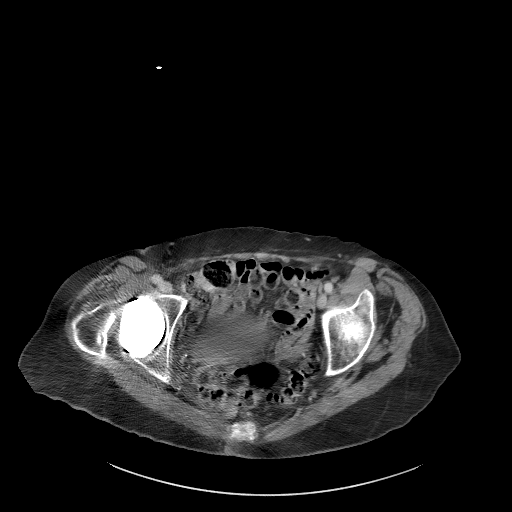
[im 24/87  soft-tissue]
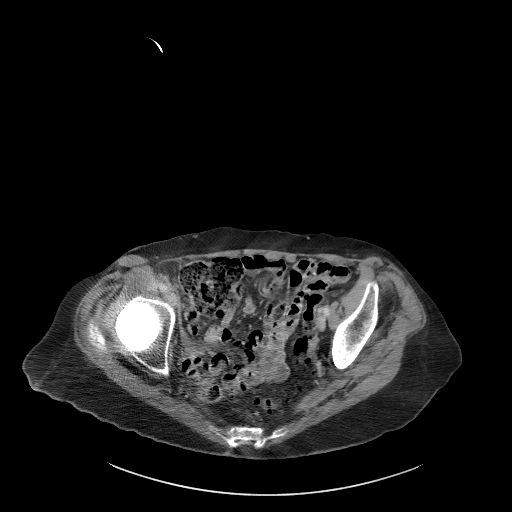
[im 29/87  soft-tissue]
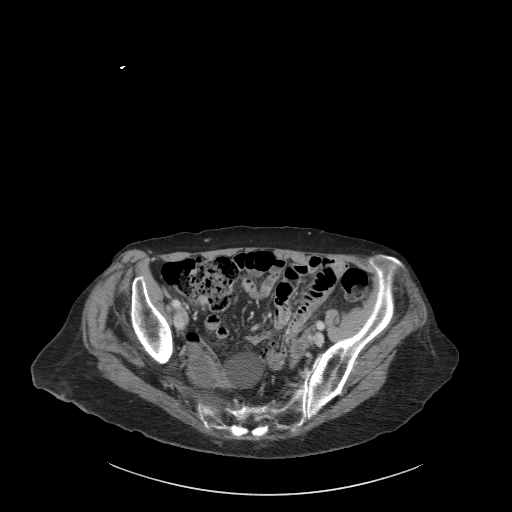
[im 39/87  soft-tissue]
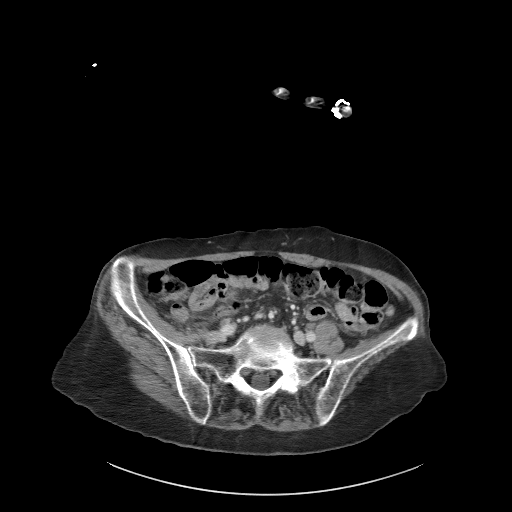
[im 44/87  soft-tissue]
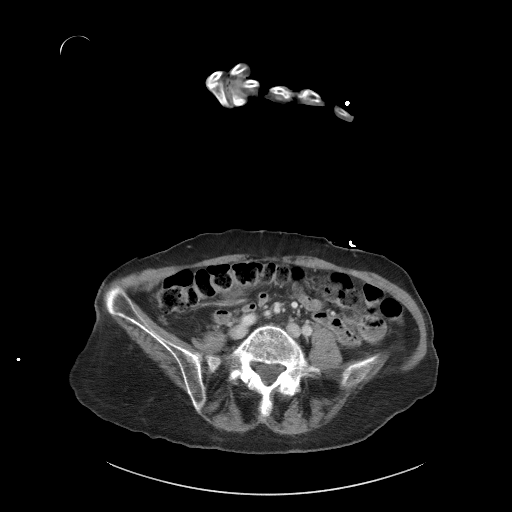
[im 48/87  soft-tissue]
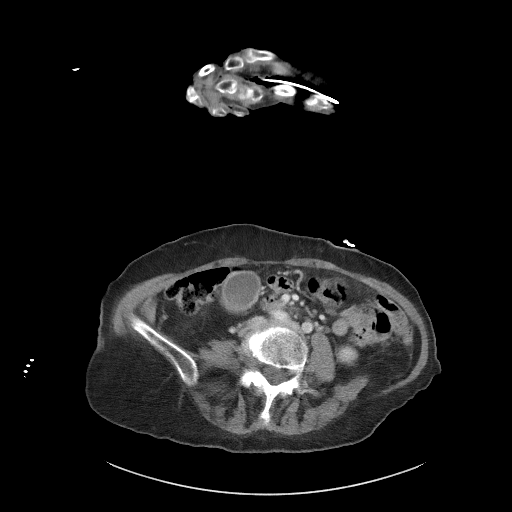
[im 58/87  soft-tissue]
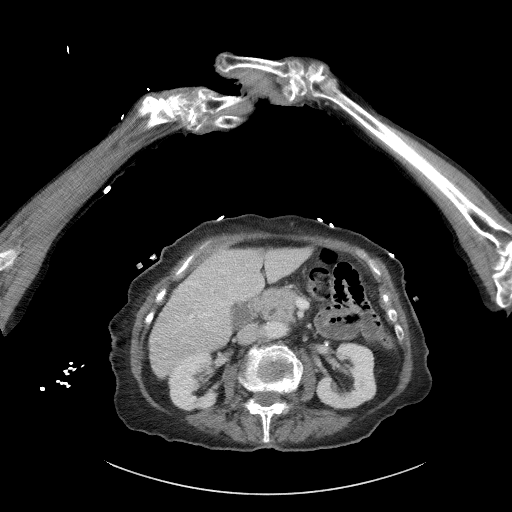
[im 58/87  bone]
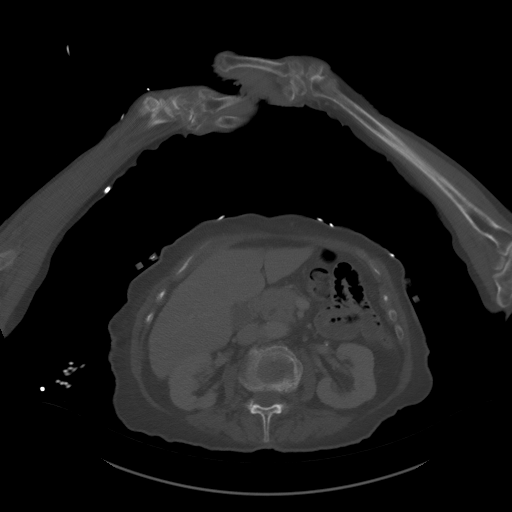
[im 63/87  soft-tissue]
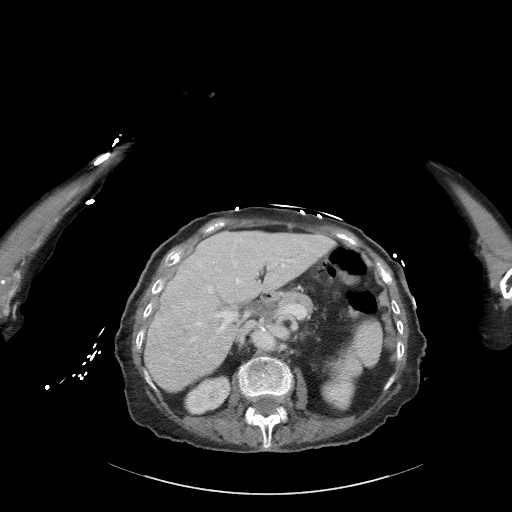
[im 67/87  soft-tissue]
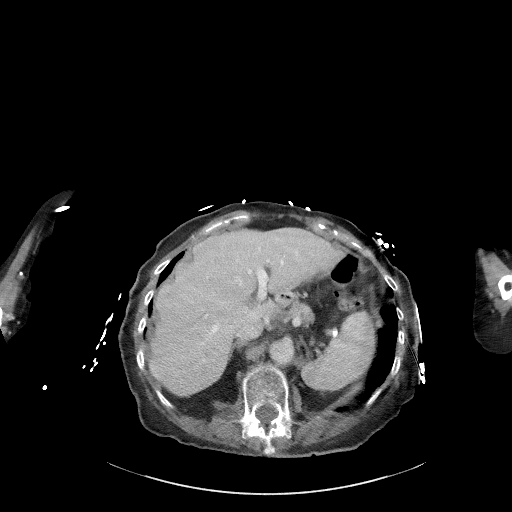
[im 77/87  soft-tissue]
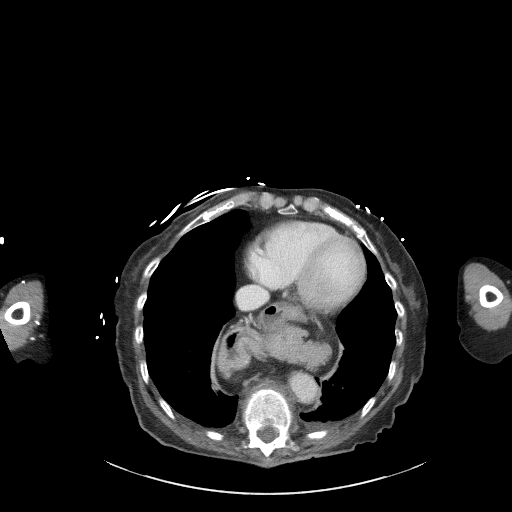
[im 82/87  soft-tissue]
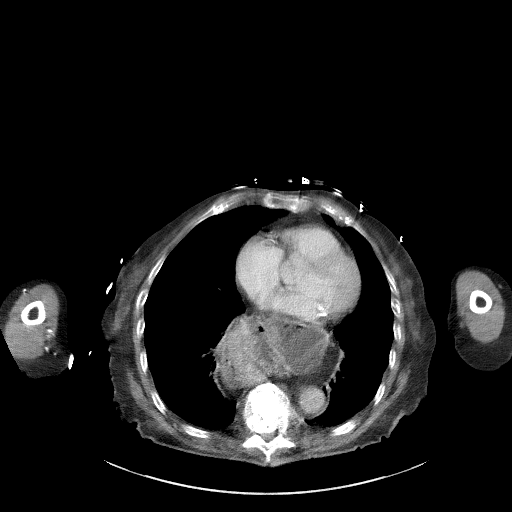

[Series 7: coronal st · coronal · 0.67mm/px · 3 of 71 slices shown]
[im 24/71  soft-tissue]
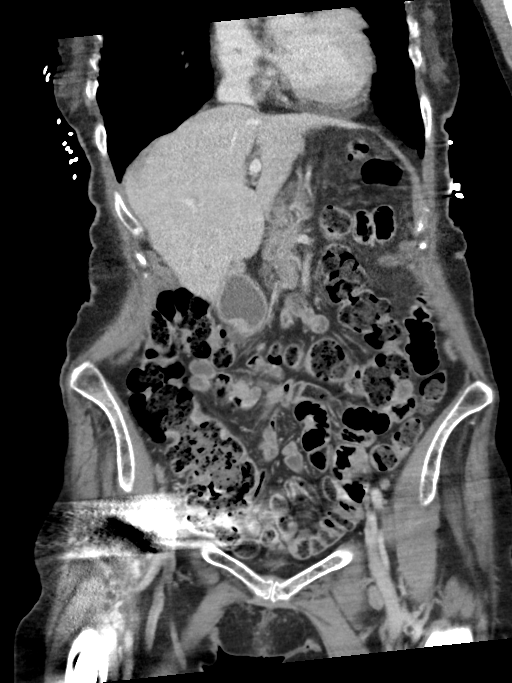
[im 32/71  soft-tissue]
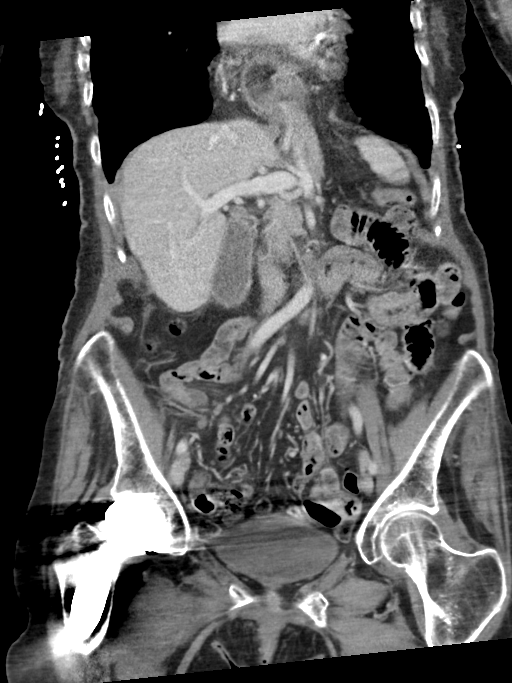
[im 39/71  soft-tissue]
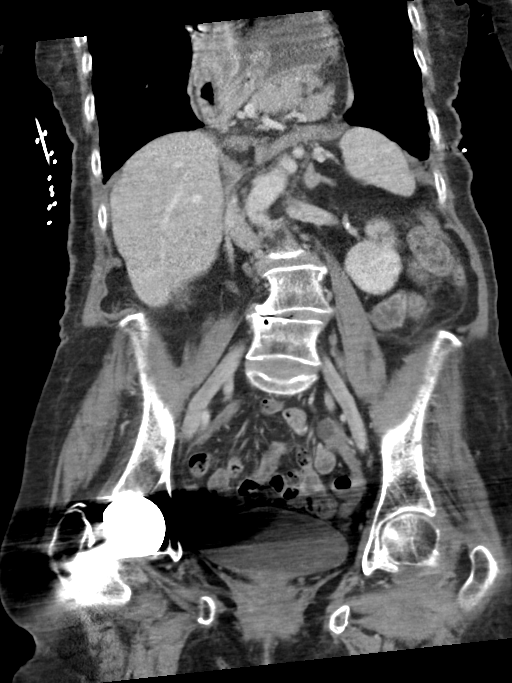

[16 of 46 positions shown; findings below may reference images not displayed]

FINDINGS: Lower chest: Large hiatal hernia containing much of the stomach and
pancreas. Linear scarring in the lung bases. No effusions.

Hepatobiliary: Small cyst in the right hepatic lobe measures 10 mm.
Gallbladder unremarkable.

Pancreas: No focal abnormality or ductal dilatation.

Spleen: No focal abnormality.  Normal size.

Adrenals/Urinary Tract: Small cyst in the lower pole of the left
kidney. No hydronephrosis. Adrenal glands and urinary bladder
unremarkable.

Stomach/Bowel: Sigmoid diverticulosis. No active diverticulitis. No
evidence of bowel obstruction.

Vascular/Lymphatic: No evidence of aneurysm or adenopathy.

Reproductive: Prior hysterectomy posterior midline pelvic cystic
structure likely arises from the right ovary and measures 3.5 x
cm. Left ovary unremarkable.

Other: No free fluid or free air.

Musculoskeletal: Mild compression fracture through the superior
endplate of L3 and moderate compression fractures at T10 and T12.
These likely are chronic.
IMPRESSION: Large hiatal hernia containing much of the stomach and the pancreas.

Sigmoid diverticulosis.  No active diverticulitis.

3.5 cm cyst extends off the right ovary. This likely reflects a
benign cyst. This could be followed with pelvic ultrasound in 6-12
months.

Multiple compression fractures in the lower thoracic and lumbar
spine as above. These appear chronic.

## 2019-02-20 IMAGING — CR DG HIP (WITH OR WITHOUT PELVIS) 2-3V*L*
2 series · 2 of 2 positions shown · non-contrast
Comparison: None.

CLINICAL DATA: Status post fall.

EXAM:
DG HIP (WITH OR WITHOUT PELVIS) 2-3V LEFT

[hip ap]
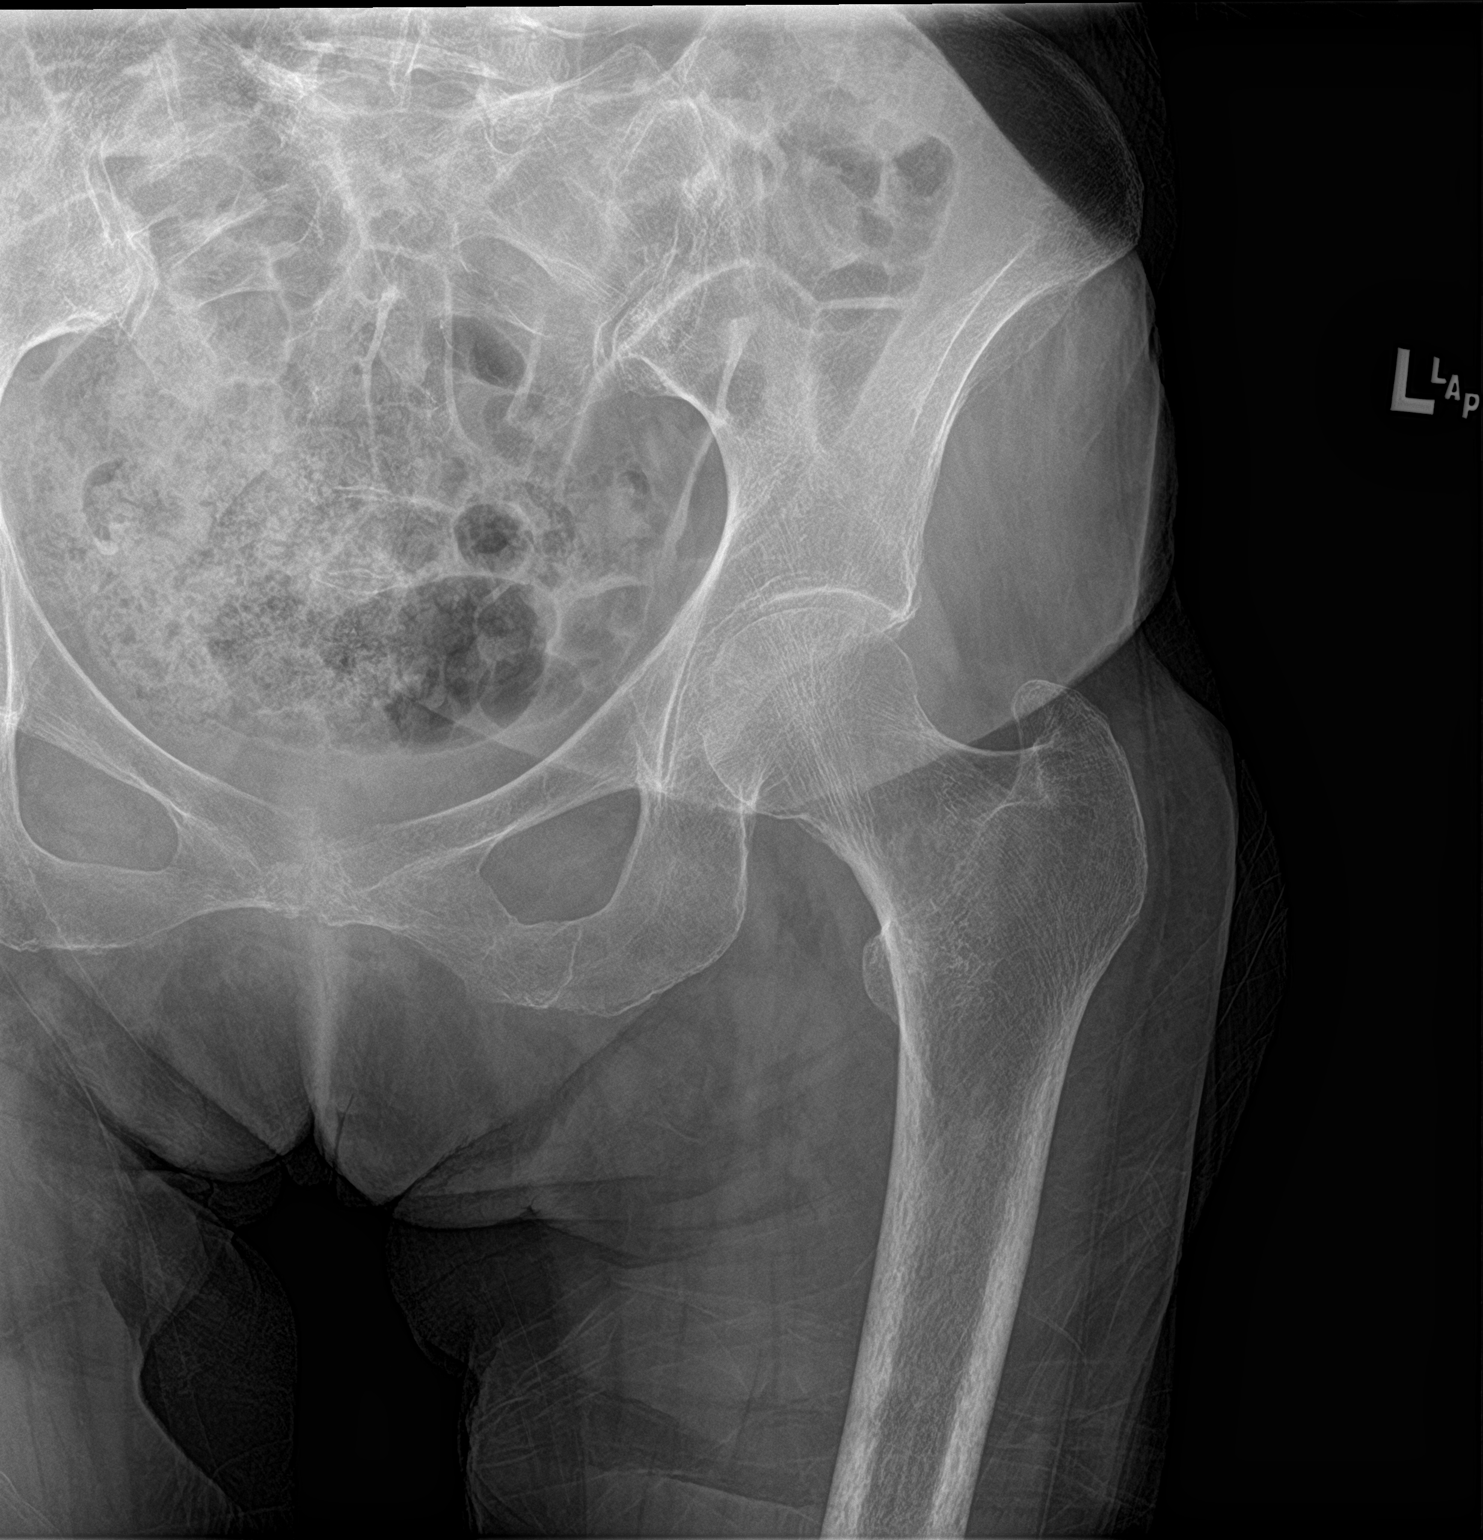

[hip lat]
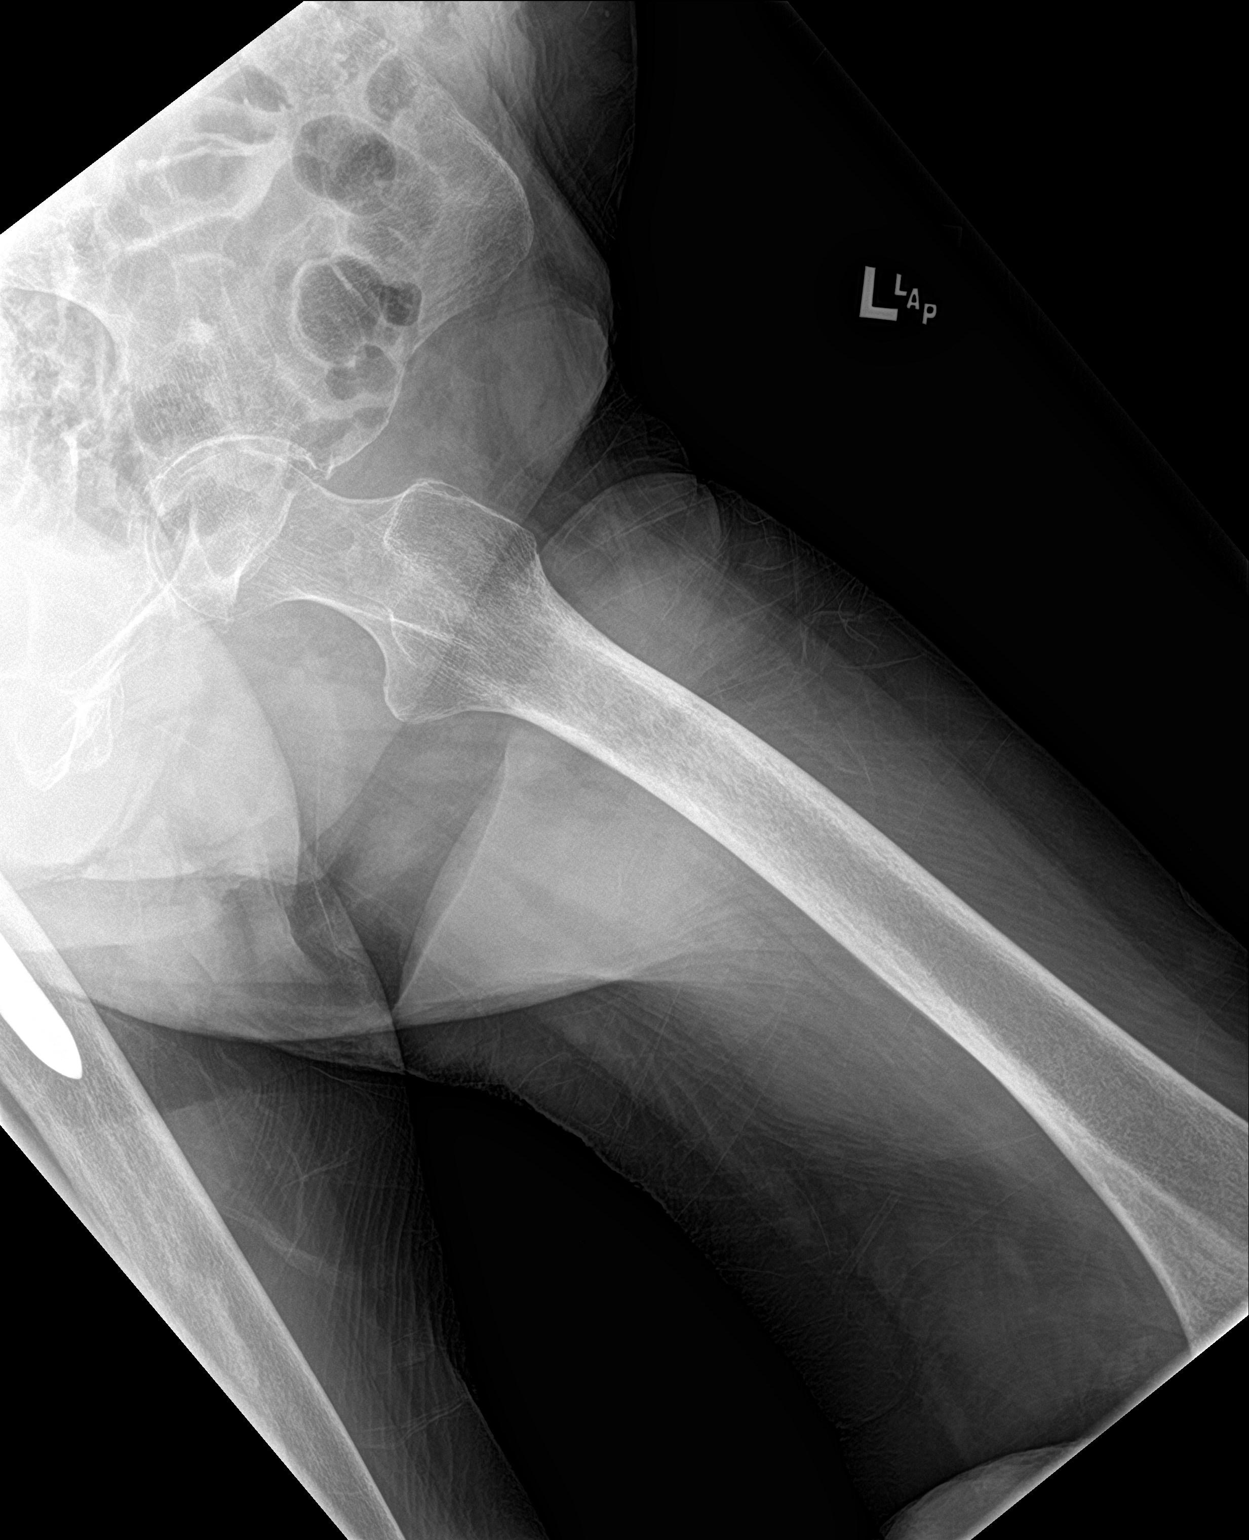

[2 of 2 positions shown; findings below may reference images not displayed]

FINDINGS: There is no evidence of hip fracture or dislocation. There is
generalized osteopenia. There is no evidence of arthropathy or other
focal bone abnormality.
IMPRESSION: No acute osseous injury of the left hip. Given the patient's age and
osteopenia, if there is persistent clinical concern for an occult
hip fracture, a MRI of the hip is recommended for increased
sensitivity.
